# Patient Record
Sex: Female | Born: 1990 | Hispanic: No | Marital: Married | Smoking: Never smoker
Health system: Southern US, Community
[De-identification: ages and names within clinical notes are randomized; demographics above are authoritative.]

## PROBLEM LIST (undated history)

## (undated) ENCOUNTER — Inpatient Hospital Stay (HOSPITAL_COMMUNITY): Payer: Self-pay

## (undated) DIAGNOSIS — H409 Unspecified glaucoma: Secondary | ICD-10-CM

## (undated) DIAGNOSIS — O139 Gestational [pregnancy-induced] hypertension without significant proteinuria, unspecified trimester: Secondary | ICD-10-CM

## (undated) DIAGNOSIS — Z3483 Encounter for supervision of other normal pregnancy, third trimester: Secondary | ICD-10-CM

## (undated) HISTORY — DX: Gestational (pregnancy-induced) hypertension without significant proteinuria, unspecified trimester: O13.9

## (undated) HISTORY — PX: NOSE SURGERY: SHX723

## (undated) HISTORY — PX: EYE SURGERY: SHX253

---
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
## 2012-06-30 ENCOUNTER — Emergency Department (HOSPITAL_COMMUNITY)
Admission: EM | Admit: 2012-06-30 | Discharge: 2012-06-30 | Disposition: A | Discharge status: Home / Self Care | Payer: Medicaid Other | Attending: Emergency Medicine | Admitting: Emergency Medicine

## 2012-06-30 ENCOUNTER — Encounter (HOSPITAL_COMMUNITY): Payer: Self-pay

## 2012-06-30 ENCOUNTER — Emergency Department (HOSPITAL_COMMUNITY): Payer: Medicaid Other

## 2012-06-30 DIAGNOSIS — Z3202 Encounter for pregnancy test, result negative: Secondary | ICD-10-CM | POA: Insufficient documentation

## 2012-06-30 DIAGNOSIS — Z8669 Personal history of other diseases of the nervous system and sense organs: Secondary | ICD-10-CM | POA: Insufficient documentation

## 2012-06-30 DIAGNOSIS — N83209 Unspecified ovarian cyst, unspecified side: Secondary | ICD-10-CM | POA: Insufficient documentation

## 2012-06-30 DIAGNOSIS — N898 Other specified noninflammatory disorders of vagina: Secondary | ICD-10-CM | POA: Insufficient documentation

## 2012-06-30 HISTORY — DX: Unspecified glaucoma: H40.9

## 2012-06-30 LAB — CBC WITH DIFFERENTIAL/PLATELET
Basophils Absolute: 0 10*3/uL (ref 0.0–0.1)
Basophils Relative: 0 % (ref 0–1)
Eosinophils Absolute: 0.1 10*3/uL (ref 0.0–0.7)
HCT: 38.3 % (ref 36.0–46.0)
Hemoglobin: 13.1 g/dL (ref 12.0–15.0)
Lymphocytes Relative: 49 % — ABNORMAL HIGH (ref 12–46)
MCH: 29.6 pg (ref 26.0–34.0)
MCHC: 34.2 g/dL (ref 30.0–36.0)
MCV: 86.7 fL (ref 78.0–100.0)
Monocytes Absolute: 0.5 10*3/uL (ref 0.1–1.0)
Monocytes Relative: 11 % (ref 3–12)
Neutro Abs: 1.5 10*3/uL — ABNORMAL LOW (ref 1.7–7.7)
Neutrophils Relative %: 37 % — ABNORMAL LOW (ref 43–77)
Platelets: 253 10*3/uL (ref 150–400)
RBC: 4.42 MIL/uL (ref 3.87–5.11)
RDW: 12.4 % (ref 11.5–15.5)

## 2012-06-30 LAB — URINALYSIS, MICROSCOPIC ONLY
Bilirubin Urine: NEGATIVE
Glucose, UA: NEGATIVE mg/dL
Hgb urine dipstick: NEGATIVE
Ketones, ur: NEGATIVE mg/dL
Leukocytes, UA: NEGATIVE
Nitrite: NEGATIVE
Protein, ur: NEGATIVE mg/dL
Specific Gravity, Urine: 1.009 (ref 1.005–1.030)
Urobilinogen, UA: 0.2 mg/dL (ref 0.0–1.0)

## 2012-06-30 LAB — COMPREHENSIVE METABOLIC PANEL
AST: 16 U/L (ref 0–37)
Albumin: 4.3 g/dL (ref 3.5–5.2)
Alkaline Phosphatase: 62 U/L (ref 39–117)
BUN: 9 mg/dL (ref 6–23)
CO2: 25 mEq/L (ref 19–32)
Calcium: 9.9 mg/dL (ref 8.4–10.5)
Chloride: 100 mEq/L (ref 96–112)
Creatinine, Ser: 0.55 mg/dL (ref 0.50–1.10)
GFR calc Af Amer: >90 mL/min (ref >90)
GFR calc non Af Amer: >90 mL/min (ref >90)
Glucose, Bld: 91 mg/dL (ref 70–99)
Total Bilirubin: 0.2 mg/dL — ABNORMAL LOW (ref 0.3–1.2)
Total Protein: 7.7 g/dL (ref 6.0–8.3)

## 2012-06-30 LAB — WET PREP, GENITAL
Clue Cells Wet Prep HPF POC: NONE SEEN
Trich, Wet Prep: NONE SEEN
Yeast Wet Prep HPF POC: NONE SEEN

## 2012-06-30 LAB — POCT PREGNANCY, URINE: Preg Test, Ur: NEGATIVE

## 2012-06-30 MED ORDER — HYDROCODONE-ACETAMINOPHEN 2.5-325 MG PO TABS: 1.0000 | ORAL_TABLET | Freq: Three times a day (TID) | ORAL | Status: DC | PRN

## 2012-06-30 NOTE — ED Provider Notes (Signed)
History     CSN: [id number]  Arrival date & time 06/30/12  1402   None    Chief Complaint  Patient presents with  . Abdominal Pain   Patient is a 22 y.o. female presenting with abdominal pain.  Abdominal Pain Associated symptoms: vaginal discharge   Associated symptoms: no constipation, no diarrhea, no dysuria, no nausea, no vaginal bleeding and no vomiting    22 y/o F with PMHx significant for glaucoma presents to ED complaining of abdominal pain for 2 days.The pain is located on RLQ  with no radiation. It is described as intermittent and dull and is not present at this time. Pt states that it hurts on the side with the act of micturition or sometimes if she sneezes or cough. Pain at its worse has been 5/10. Pt had white thin vaginal discharge 2 days before the pain started and then reports has resolved. Pt denies fever, chills, nausea, vomiting, burning with urination or frequency.   Past Medical History  Diagnosis Date  . Glaucoma     Past Surgical History  Procedure Laterality Date  . Eye surgery      History reviewed. No pertinent family history.  History  Substance Use Topics  . Smoking status: Never Smoker   . Smokeless tobacco: Not on file  . Alcohol Use: No   OB History   Grav Para Term Preterm Abortions TAB SAB Ect Mult Living                 Review of Systems  Constitutional: Negative.   HENT: Negative.   Respiratory: Negative.   Cardiovascular: Negative.   Gastrointestinal: Positive for abdominal pain. Negative for nausea, vomiting, diarrhea, constipation and abdominal distention.  Endocrine: Negative.   Genitourinary: Positive for vaginal discharge. Negative for dysuria, flank pain, vaginal bleeding and pelvic pain.  Musculoskeletal: Negative.   Skin: Negative.   Neurological: Negative.   Psychiatric/Behavioral: Negative.    Allergies  Penicillins  Home Medications  No current outpatient prescriptions on file.  BP 132/87  Pulse 100  Temp(Src)  98.1 F (36.7 C) (Oral)  Resp 16  SpO2 100%  LMP 06/12/2012  Physical Exam  Constitutional: She is oriented to person, place, and time. She appears well-developed and well-nourished. No distress.  HENT:  Head: Normocephalic and atraumatic.  Mouth/Throat: No oropharyngeal exudate.  Eyes: Conjunctivae are normal. Pupils are equal, round, and reactive to light.  Neck: Normal range of motion. Neck supple.  Cardiovascular: Normal rate, regular rhythm, normal heart sounds and intact distal pulses.  Exam reveals no gallop and no friction rub.   No murmur heard. Pulmonary/Chest: Effort normal and breath sounds normal.  Abdominal: Soft. Bowel sounds are normal. She exhibits no distension. There is tenderness.  Tenderness to deep palpation on RLQ. No guarding or rebound tenderness.   Genitourinary:  Vulva and perianal area: Normal  Speculum: Vagina and cervix of normal appearance, no friability, no discharge. Bimanual exam: Uterus anteverted, no adnexal masses felt on palpation. No cervical motion tenderness.    Musculoskeletal: Normal range of motion.  Neurological: She is alert and oriented to person, place, and time.    ED Course  Procedures (including critical care time)  Labs Reviewed  CBC WITH DIFFERENTIAL - Abnormal; Notable for the following:    Neutrophils Relative 37 (*)    Neutro Abs 1.5 (*)    Lymphocytes Relative 49 (*)    All other components within normal limits  COMPREHENSIVE METABOLIC PANEL - Abnormal; Notable for the  following:    Total Bilirubin 0.2 (*)    All other components within normal limits  URINALYSIS, MICROSCOPIC ONLY  POCT PREGNANCY, URINE   US Transvaginal Non-ob  06/30/2012  *RADIOLOGY REPORT*  Clinical Data:  22 year old with right lower quadrant pain.  TRANSABDOMINAL AND TRANSVAGINAL ULTRASOUND OF PELVIS DOPPLER ULTRASOUND OF OVARIES  Technique:  Both transabdominal and transvaginal ultrasound examinations of the pelvis were performed. Transabdominal  technique was performed for global imaging of the pelvis including uterus, ovaries, adnexal regions, and pelvic cul-de-sac.  It was necessary to proceed with endovaginal exam following the transabdominal exam to visualize the ovaries.  Color and duplex Doppler ultrasound was utilized to evaluate blood flow to the ovaries.  Comparison:  None.  Findings:  Uterus:  Normal anteverted position of the uterus.  Uterus measures 7.0 x 3.3 x 3.9 cm. A septate uterus cannot be excluded.  Endometrium:  Endometrium measures 1.1 cm.  Right ovary: The right ovary is enlarged and measures 6.0 x 4.4 x 5.0 cm. There is a complex structure within the right ovary that measures 4.5 x 3.3 x 3.7 cm.  The structure has a reticular pattern with multiple echoes.  No significant flow identified within this complex ovarian structure.  Left ovary:   Normal appearance of the left ovary measuring 3.0 x 1.5 x 2.2 cm.  Pulsed Doppler evaluation demonstrates normal low-resistance arterial and venous waveforms in both ovaries.  IMPRESSION: There is a complex lesion involving the right ovary that measures up to 4.5 cm. The lesion has a reticular pattern with multiple internal echoes.  Findings are most compatible with a hemorrhagic cyst.  No evidence for ovarian torsion.   Original Report Authenticated By: Richarda Overlie, M.D.    US Pelvis Complete  06/30/2012  *RADIOLOGY REPORT*  Clinical Data:  22 year old with right lower quadrant pain.  TRANSABDOMINAL AND TRANSVAGINAL ULTRASOUND OF PELVIS DOPPLER ULTRASOUND OF OVARIES  Technique:  Both transabdominal and transvaginal ultrasound examinations of the pelvis were performed. Transabdominal technique was performed for global imaging of the pelvis including uterus, ovaries, adnexal regions, and pelvic cul-de-sac.  It was necessary to proceed with endovaginal exam following the transabdominal exam to visualize the ovaries.  Color and duplex Doppler ultrasound was utilized to evaluate blood flow to the  ovaries.  Comparison:  None.  Findings:  Uterus:  Normal anteverted position of the uterus.  Uterus measures 7.0 x 3.3 x 3.9 cm. A septate uterus cannot be excluded.  Endometrium:  Endometrium measures 1.1 cm.  Right ovary: The right ovary is enlarged and measures 6.0 x 4.4 x 5.0 cm. There is a complex structure within the right ovary that measures 4.5 x 3.3 x 3.7 cm.  The structure has a reticular pattern with multiple echoes.  No significant flow identified within this complex ovarian structure.  Left ovary:   Normal appearance of the left ovary measuring 3.0 x 1.5 x 2.2 cm.  Pulsed Doppler evaluation demonstrates normal low-resistance arterial and venous waveforms in both ovaries.  IMPRESSION: There is a complex lesion involving the right ovary that measures up to 4.5 cm. The lesion has a reticular pattern with multiple internal echoes.  Findings are most compatible with a hemorrhagic cyst.  No evidence for ovarian torsion.   Original Report Authenticated By: Richarda Overlie, M.D.    [place] Art/ven Flow Abd Pelv Doppler  06/30/2012  *RADIOLOGY REPORT*  Clinical Data:  22 year old with right lower quadrant pain.  TRANSABDOMINAL AND TRANSVAGINAL ULTRASOUND OF PELVIS DOPPLER ULTRASOUND OF OVARIES  Technique:  Both transabdominal and transvaginal ultrasound examinations of the pelvis were performed. Transabdominal technique was performed for global imaging of the pelvis including uterus, ovaries, adnexal regions, and pelvic cul-de-sac.  It was necessary to proceed with endovaginal exam following the transabdominal exam to visualize the ovaries.  Color and duplex Doppler ultrasound was utilized to evaluate blood flow to the ovaries.  Comparison:  None.  Findings:  Uterus:  Normal anteverted position of the uterus.  Uterus measures 7.0 x 3.3 x 3.9 cm. A septate uterus cannot be excluded.  Endometrium:  Endometrium measures 1.1 cm.  Right ovary: The right ovary is enlarged and measures 6.0 x 4.4 x 5.0 cm. There is a complex  structure within the right ovary that measures 4.5 x 3.3 x 3.7 cm.  The structure has a reticular pattern with multiple echoes.  No significant flow identified within this complex ovarian structure.  Left ovary:   Normal appearance of the left ovary measuring 3.0 x 1.5 x 2.2 cm.  Pulsed Doppler evaluation demonstrates normal low-resistance arterial and venous waveforms in both ovaries.  IMPRESSION: There is a complex lesion involving the right ovary that measures up to 4.5 cm. The lesion has a reticular pattern with multiple internal echoes.  Findings are most compatible with a hemorrhagic cyst.  No evidence for ovarian torsion.   Original Report Authenticated By: Richarda Overlie, M.D.     No diagnosis found.  MDM   Consulted with OBGYN on call. Pt should follow up as outpatient unless signs of acute abdomen appear. We will discusse signs of worsening condition that should prompt re-evaluation with pt.         Dayarmys Piloto de Criselda Peaches, MD 06/30/12 [phone]

## 2012-06-30 NOTE — ED Notes (Signed)
Pt c/o RLQ pain x2 days, increase pain w/urination or sneezing in the RLQ not the vaginal area, increased amount of clear vaginal dc x5 days, pt denies abnormal odor or vaginal bleeding

## 2012-07-02 NOTE — ED Provider Notes (Signed)
I saw the patient, reviewed the resident's note and I agree with the findings and plan.   .Face to face Exam:  General:  Awake HEENT:  Atraumatic Resp:  Normal effort Abd:  Nondistended Neuro:No focal weakness   Nelia Shi, MD 07/02/12 [phone]

## 2012-07-03 ENCOUNTER — Encounter (HOSPITAL_COMMUNITY): Payer: Self-pay | Admitting: General Practice

## 2012-07-03 ENCOUNTER — Inpatient Hospital Stay (HOSPITAL_COMMUNITY)
Admission: AD | Admit: 2012-07-03 | Discharge: 2012-07-03 | Disposition: A | Discharge status: Home / Self Care | Payer: Medicaid Other | Source: Ambulatory Visit | Attending: Obstetrics & Gynecology | Admitting: Obstetrics & Gynecology

## 2012-07-03 DIAGNOSIS — N83209 Unspecified ovarian cyst, unspecified side: Secondary | ICD-10-CM | POA: Insufficient documentation

## 2012-07-03 DIAGNOSIS — N83201 Unspecified ovarian cyst, right side: Secondary | ICD-10-CM

## 2012-07-03 DIAGNOSIS — R1031 Right lower quadrant pain: Secondary | ICD-10-CM | POA: Insufficient documentation

## 2012-07-03 NOTE — MAU Note (Signed)
Right lower abdominal pain increased last night. Pain recurs every 5-10 min and is an 8/10

## 2012-07-03 NOTE — MAU Note (Signed)
RLQ pain x 6 days, seen @ [facility] on Friday - was told had ovarian cyst, should come here with further problems.  Pain has gotten worse.  Denies bleeding.

## 2012-07-03 NOTE — MAU Provider Note (Signed)
History     CSN: [id number]  Arrival date and time: 07/03/12 1302   First Provider Initiated Contact with Patient 07/03/12 1550      Chief Complaint  Patient presents with  . Abdominal Pain   HPI Ms. Jessica Barton is a 22 y.o. G0 who presents to MAU today with complaint of RLQ pain. The patient was evaluated on Friday at [facility] for the same problem and diagnosed with ovarian cyst. The patient states that the pain is the same as it was then. She has not tried to take any pain medicine yet. She denies vaginal bleeding or abnormal vaginal discharge at this time. The patient already has a follow-up appointment scheduled in clinic for 08/02/12. The patient is concerned that this will impact her ability to conceive. She has been trying to conceive for about 8 months.    OB History   Grav Para Term Preterm Abortions TAB SAB Ect Mult Living                  Past Medical History  Diagnosis Date  . Glaucoma     Past Surgical History  Procedure Laterality Date  . Eye surgery      No family history on file.  History  Substance Use Topics  . Smoking status: Never Smoker   . Smokeless tobacco: Not on file  . Alcohol Use: No    Allergies:  Allergies  Allergen Reactions  . Penicillins Rash    Prescriptions prior to admission  Medication Sig Dispense Refill  . folic acid (FOLVITE) 800 MCG tablet Take 800 mcg by mouth daily.      Marland Kitchen ibuprofen (ADVIL,MOTRIN) 200 MG tablet Take 400 mg by mouth every 6 (six) hours as needed for pain.        Review of Systems  Constitutional: Negative for fever and malaise/fatigue.  Gastrointestinal: Positive for abdominal pain. Negative for nausea, vomiting, diarrhea and constipation.  Genitourinary: Negative for dysuria, urgency and frequency.       Neg - vaginal discharge Neg - vaginal bleeding  Neurological: Negative for dizziness.   Physical Exam   Blood pressure 127/63, pulse 94, temperature 99 F (37.2 C), temperature source Oral, resp.  rate 18, height 5\' 4"  (1.626 m), weight 151 lb 3.2 oz (68.584 kg), last menstrual period 06/15/2012, SpO2 100.00%.  Physical Exam  Constitutional: She is oriented to person, place, and time. She appears well-developed and well-nourished. No distress.  HENT:  Head: Normocephalic and atraumatic.  Cardiovascular: Normal rate, regular rhythm and normal heart sounds.   Respiratory: Effort normal and breath sounds normal. No respiratory distress.  GI: Soft. Bowel sounds are normal. She exhibits no distension and no mass. There is tenderness (moderate right suprapubic tenderness to palpation). There is no rebound and no guarding.  Neurological: She is alert and oriented to person, place, and time.  Skin: Skin is warm and dry. No erythema.  Psychiatric: She has a normal mood and affect.    MAU Course  Procedures None  MDM Full work-up last week at [facility] for RLQ pain. Ovarian cyst. Patient has not taken any pain medications. No change in pain or symptoms since then.   Assessment and Plan  A: Right ovarian cyst  P: Discharge home Patient encouraged to take Ibuprofen for pain or Percocet previously prescribed PRN Patient encouraged to keep scheduled follow-up in clinic  Patient may return to MAU as needed or if her condition were to change or worsen  Freddi Starr,  PA-C  07/03/2012, 3:54 PM

## 2012-08-02 ENCOUNTER — Ambulatory Visit (INDEPENDENT_AMBULATORY_CARE_PROVIDER_SITE_OTHER): Payer: Medicaid Other | Admitting: Obstetrics & Gynecology

## 2012-08-02 ENCOUNTER — Encounter: Payer: Self-pay | Admitting: Obstetrics & Gynecology

## 2012-08-02 VITALS — BP 107/75 | HR 95 | Temp 99.1°F | Ht 64.0 in | Wt 151.9 lb

## 2012-08-02 DIAGNOSIS — N83209 Unspecified ovarian cyst, unspecified side: Secondary | ICD-10-CM

## 2012-08-02 NOTE — Patient Instructions (Addendum)
Ovarian Cyst The ovaries are small organs that are on each side of the uterus. The ovaries are the organs that produce the female hormones, estrogen and progesterone. An ovarian cyst is a sac filled with fluid that can vary in its size. It is normal for a small cyst to form in women who are in the childbearing age and who have menstrual periods. This type of cyst is called a follicle cyst that becomes an ovulation cyst (corpus luteum cyst) after it produces the women's egg. It later goes away on its own if the woman does not become pregnant. There are other kinds of ovarian cysts that may cause problems and may need to be treated. The most serious problem is a cyst with cancer. It should be noted that menopausal women who have an ovarian cyst are at a higher risk of it being a cancer cyst. They should be evaluated very quickly, thoroughly and followed closely. This is especially true in menopausal women because of the high rate of ovarian cancer in women in menopause. CAUSES AND TYPES OF OVARIAN CYSTS:  FUNCTIONAL CYST: The follicle/corpus luteum cyst is a functional cyst that occurs every month during ovulation with the menstrual cycle. They go away with the next menstrual cycle if the woman does not get pregnant. Usually, there are no symptoms with a functional cyst.  ENDOMETRIOMA CYST: This cyst develops from the lining of the uterus tissue. This cyst gets in or on the ovary. It grows every month from the bleeding during the menstrual period. It is also called a "chocolate cyst" because it becomes filled with blood that turns brown. This cyst can cause pain in the lower abdomen during intercourse and with your menstrual period.  CYSTADENOMA CYST: This cyst develops from the cells on the outside of the ovary. They usually are not cancerous. They can get very big and cause lower abdomen pain and pain with intercourse. This type of cyst can twist on itself, cut off its blood supply and cause severe pain. It  also can easily rupture and cause a lot of pain.  DERMOID CYST: This type of cyst is sometimes found in both ovaries. They are found to have different kinds of body tissue in the cyst. The tissue includes skin, teeth, hair, and/or cartilage. They usually do not have symptoms unless they get very big. Dermoid cysts are rarely cancerous.  POLYCYSTIC OVARY: This is a rare condition with hormone problems that produces many small cysts on both ovaries. The cysts are follicle-like cysts that never produce an egg and become a corpus luteum. It can cause an increase in body weight, infertility, acne, increase in body and facial hair and lack of menstrual periods or rare menstrual periods. Many women with this problem develop type 2 diabetes. The exact cause of this problem is unknown. A polycystic ovary is rarely cancerous.  THECA LUTEIN CYST: Occurs when too much hormone (human chorionic gonadotropin) is produced and over-stimulates the ovaries to produce an egg. They are frequently seen when doctors stimulate the ovaries for invitro-fertilization (test tube babies).  LUTEOMA CYST: This cyst is seen during pregnancy. Rarely it can cause an obstruction to the birth canal during labor and delivery. They usually go away after delivery. SYMPTOMS   Pelvic pain or pressure.  Pain during sexual intercourse.  Increasing girth (swelling) of the abdomen.  Abnormal menstrual periods.  Increasing pain with menstrual periods.  You stop having menstrual periods and you are not pregnant. DIAGNOSIS  The diagnosis can   be made during:  Routine or annual pelvic examination (common).  Ultrasound.  X-ray of the pelvis.  CT Scan.  MRI.  Blood tests. TREATMENT   Treatment may only be to follow the cyst monthly for 2 to 3 months with your caregiver. Many go away on their own, especially functional cysts.  May be aspirated (drained) with a long needle with ultrasound, or by laparoscopy (inserting a tube into  the pelvis through a small incision).  The whole cyst can be removed by laparoscopy.  Sometimes the cyst may need to be removed through an incision in the lower abdomen.  Hormone treatment is sometimes used to help dissolve certain cysts.  Birth control pills are sometimes used to help dissolve certain cysts. HOME CARE INSTRUCTIONS  Follow your caregiver's advice regarding:  Medicine.  Follow up visits to evaluate and treat the cyst.  You may need to come back or make an appointment with another caregiver, to find the exact cause of your cyst, if your caregiver is not a gynecologist.  Get your yearly and recommended pelvic examinations and Pap tests.  Let your caregiver know if you have had an ovarian cyst in the past. SEEK MEDICAL CARE IF:   Your periods are late, irregular, they stop, or are painful.  Your stomach (abdomen) or pelvic pain does not go away.  Your stomach becomes larger or swollen.  You have pressure on your bladder or trouble emptying your bladder completely.  You have painful sexual intercourse.  You have feelings of fullness, pressure, or discomfort in your stomach.  You lose weight for no apparent reason.  You feel generally ill.  You become constipated.  You lose your appetite.  You develop acne.  You have an increase in body and facial hair.  You are gaining weight, without changing your exercise and eating habits.  You think you are pregnant. SEEK IMMEDIATE MEDICAL CARE IF:   You have increasing abdominal pain.  You feel sick to your stomach (nausea) and/or vomit.  You develop a fever that comes on suddenly.  You develop abdominal pain during a bowel movement.  Your menstrual periods become heavier than usual. Document Released: 03/22/2005 Document Revised: 06/14/2011 Document Reviewed: 01/23/2009 Exit[employer] Patient Information 2013 [employer].  

## 2012-08-02 NOTE — Progress Notes (Signed)
Subjective:     Patient ID: Jessica Barton, female   DOB: Mar 08, 1991, 22 y.o.   MRN: [id number]  HPI Pt was seen in the ED and was diagnosed with a right ovarian cyst.  She reports that she has had no further since 3 days after she left the ED.  She is actively trying to conceive    Review of Systems     Objective:   Physical Exam BP 107/75  Pulse 95  Temp(Src) 99.1 F (37.3 C) (Oral)  Ht 5\' 4"  (1.626 m)  Wt 151 lb 14.4 oz (68.901 kg)  BMI 26.06 kg/m2  LMP 07/11/2012 Pt in NAD Abd: soft, NT, ND GU: EGBUS: no lesions Vagina: no blood in vault Cervix: no lesion; no CMT Uterus: small, mobile Adnexa: no masses; sl tender on right side.  Left side nontender       07/01/2012 Clinical Data: 22 year old with right lower quadrant pain.  TRANSABDOMINAL AND TRANSVAGINAL ULTRASOUND OF PELVIS  DOPPLER ULTRASOUND OF OVARIES  Technique: Both transabdominal and transvaginal ultrasound  examinations of the pelvis were performed. Transabdominal technique  was performed for global imaging of the pelvis including uterus,  ovaries, adnexal regions, and pelvic cul-de-sac.  It was necessary to proceed with endovaginal exam following the  transabdominal exam to visualize the ovaries.  Color and duplex Doppler ultrasound was utilized to evaluate blood  flow to the ovaries.  Comparison: None.  Findings:  Uterus: Normal anteverted position of the uterus. Uterus measures  7.0 x 3.3 x 3.9 cm. A septate uterus cannot be excluded.  Endometrium: Endometrium measures 1.1 cm.  Right ovary: The right ovary is enlarged and measures 6.0 x 4.4 x  5.0 cm. There is a complex structure within the right ovary that  measures 4.5 x 3.3 x 3.7 cm. The structure has a reticular pattern  with multiple echoes. No significant flow identified within this  complex ovarian structure.  Left ovary: Normal appearance of the left ovary measuring 3.0 x  1.5 x 2.2 cm.  Pulsed Doppler evaluation demonstrates normal  low-resistance  arterial and venous waveforms in both ovaries.  IMPRESSION:  There is a complex lesion involving the right ovary that measures  up to 4.5 cm. The lesion has a reticular pattern with multiple  internal echoes. Findings are most compatible with a hemorrhagic  cyst.  No evidence for ovarian torsion.      Assessment:     Hemorrhagic cyst- sx improved     Plan:     F/u 3 months or sooner F/u sono in 2 months

## 2012-09-27 ENCOUNTER — Ambulatory Visit (HOSPITAL_COMMUNITY)
Admission: RE | Admit: 2012-09-27 | Discharge: 2012-09-27 | Disposition: A | Discharge status: Home / Self Care | Payer: Medicaid Other | Source: Ambulatory Visit | Attending: Obstetrics & Gynecology | Admitting: Obstetrics & Gynecology

## 2012-09-27 DIAGNOSIS — N949 Unspecified condition associated with female genital organs and menstrual cycle: Secondary | ICD-10-CM | POA: Insufficient documentation

## 2012-09-27 DIAGNOSIS — N83209 Unspecified ovarian cyst, unspecified side: Secondary | ICD-10-CM | POA: Insufficient documentation

## 2012-09-27 DIAGNOSIS — R1031 Right lower quadrant pain: Secondary | ICD-10-CM | POA: Insufficient documentation

## 2012-10-05 ENCOUNTER — Telehealth: Payer: Self-pay | Admitting: Pediatrics

## 2012-10-05 ENCOUNTER — Encounter: Payer: Self-pay | Admitting: Obstetrics & Gynecology

## 2012-10-05 ENCOUNTER — Encounter: Payer: Self-pay | Admitting: Pediatrics

## 2012-10-05 NOTE — Telephone Encounter (Signed)
Spoke with patient by telephone to inform her her gyn questions were being misdirected to me (pediatrics) and advised her on contacting the gynecologist.  Patient was courteous and thanked MD for the call.

## 2012-10-15 ENCOUNTER — Encounter: Payer: Self-pay | Admitting: Obstetrics & Gynecology

## 2012-10-25 ENCOUNTER — Ambulatory Visit: Payer: Medicaid Other | Admitting: Family Medicine

## 2013-02-08 ENCOUNTER — Other Ambulatory Visit: Payer: Self-pay

## 2013-03-21 LAB — OB RESULTS CONSOLE GC/CHLAMYDIA
CHLAMYDIA, DNA PROBE: NEGATIVE
Gonorrhea: NEGATIVE

## 2013-03-21 LAB — OB RESULTS CONSOLE HIV ANTIBODY (ROUTINE TESTING): HIV: NONREACTIVE

## 2013-03-23 LAB — OB RESULTS CONSOLE ANTIBODY SCREEN: Antibody Screen: NEGATIVE

## 2013-03-23 LAB — OB RESULTS CONSOLE RPR: RPR: NONREACTIVE

## 2013-03-23 LAB — OB RESULTS CONSOLE RUBELLA ANTIBODY, IGM: Rubella: IMMUNE

## 2013-03-23 LAB — OB RESULTS CONSOLE PLATELET COUNT: PLATELETS: 203 10*3/uL

## 2013-03-23 LAB — OB RESULTS CONSOLE HGB/HCT, BLOOD
HEMATOCRIT: 36 %
HEMOGLOBIN: 12.2 g/dL

## 2013-03-23 LAB — OB RESULTS CONSOLE HEPATITIS B SURFACE ANTIGEN: Hepatitis B Surface Ag: NEGATIVE

## 2013-03-23 LAB — OB RESULTS CONSOLE ABO/RH: RH Type: POSITIVE

## 2013-04-05 NOTE — L&D Delivery Note (Signed)
Attestation of Attending Supervision of Advanced Practitioner: Evaluation and management procedures were performed by the PA/NP/CNM/OB Fellow under my supervision/collaboration. Chart reviewed and agree with management and plan.  Farrah Skoda V 10/22/2013 6:29 AM   

## 2013-04-05 NOTE — L&D Delivery Note (Signed)
Delivery Note Pushed well for just over 30 minutes. Then vertex began to descend quickly.    At 6:06 PM a viable and healthy female was delivered via Vaginal, Spontaneous Delivery (Presentation: Right OP).  APGAR: 9, 9; weight 6 lb 15.1 oz (3150 g).   Placenta status: Intact, Spontaneous.  Cord: 3 vessels with the following complications: None.    Pt was noted to have a Left labial split, partial third degree laceration, and a right sulcus laceration extending 6-8 cm toward cervix.  Dr Emelda FearFerguson consulted for repair and he did the repair in the usual fashion.  Anesthesia: Epidural  Episiotomy: None Lacerations: Partial 3rd degree with right sulcus laceration and left labial split Suture Repair: 3.0 vicryl rapide 4.0 vicryl rapide  Vaginal pack placed by Dr Emelda FearFerguson, to be removed in an hour Est. Blood Loss (mL): 200  Mom to postpartum.  Baby to Couplet care / Skin to Skin.  Wynelle BourgeoisWILLIAMS,Bethan Adamek 10/19/2013, 7:22 PM

## 2013-07-19 DIAGNOSIS — Z363 Encounter for antenatal screening for malformations: Secondary | ICD-10-CM

## 2013-07-19 DIAGNOSIS — O26849 Uterine size-date discrepancy, unspecified trimester: Secondary | ICD-10-CM | POA: Insufficient documentation

## 2013-07-19 LAB — US OB COMP + 14 WK

## 2013-07-23 ENCOUNTER — Encounter: Payer: Self-pay | Admitting: *Deleted

## 2013-07-26 LAB — OB RESULTS CONSOLE GC/CHLAMYDIA
Chlamydia: NEGATIVE
Gonorrhea: NEGATIVE

## 2013-07-26 LAB — OB RESULTS CONSOLE HIV ANTIBODY (ROUTINE TESTING): HIV: NONREACTIVE

## 2013-07-30 LAB — GLUCOSE TOLERANCE, 1 HOUR: Glucose, 1 Hour GTT: 100

## 2013-07-30 LAB — OB RESULTS CONSOLE RPR: RPR: NONREACTIVE

## 2013-08-02 ENCOUNTER — Encounter: Payer: Self-pay | Admitting: Family Medicine

## 2013-08-09 ENCOUNTER — Ambulatory Visit (INDEPENDENT_AMBULATORY_CARE_PROVIDER_SITE_OTHER): Payer: Medicaid Other | Admitting: Obstetrics & Gynecology

## 2013-08-09 ENCOUNTER — Encounter: Payer: Self-pay | Admitting: Obstetrics & Gynecology

## 2013-08-09 VITALS — BP 118/80 | HR 103 | Wt 174.5 lb

## 2013-08-09 DIAGNOSIS — O2341 Unspecified infection of urinary tract in pregnancy, first trimester: Secondary | ICD-10-CM

## 2013-08-09 DIAGNOSIS — N898 Other specified noninflammatory disorders of vagina: Secondary | ICD-10-CM

## 2013-08-09 DIAGNOSIS — Z23 Encounter for immunization: Secondary | ICD-10-CM

## 2013-08-09 DIAGNOSIS — B951 Streptococcus, group B, as the cause of diseases classified elsewhere: Secondary | ICD-10-CM | POA: Insufficient documentation

## 2013-08-09 DIAGNOSIS — O2390 Unspecified genitourinary tract infection in pregnancy, unspecified trimester: Secondary | ICD-10-CM

## 2013-08-09 DIAGNOSIS — Z3490 Encounter for supervision of normal pregnancy, unspecified, unspecified trimester: Secondary | ICD-10-CM | POA: Insufficient documentation

## 2013-08-09 DIAGNOSIS — O26893 Other specified pregnancy related conditions, third trimester: Secondary | ICD-10-CM

## 2013-08-09 DIAGNOSIS — N390 Urinary tract infection, site not specified: Secondary | ICD-10-CM

## 2013-08-09 DIAGNOSIS — Z3480 Encounter for supervision of other normal pregnancy, unspecified trimester: Secondary | ICD-10-CM

## 2013-08-09 DIAGNOSIS — O9989 Other specified diseases and conditions complicating pregnancy, childbirth and the puerperium: Secondary | ICD-10-CM

## 2013-08-09 LAB — POCT URINALYSIS DIP (DEVICE)
BILIRUBIN URINE: NEGATIVE
Glucose, UA: NEGATIVE mg/dL
Hgb urine dipstick: NEGATIVE
Ketones, ur: NEGATIVE mg/dL
LEUKOCYTES UA: NEGATIVE
NITRITE: NEGATIVE
PH: 6.5 (ref 5.0–8.0)
PROTEIN: NEGATIVE mg/dL
Specific Gravity, Urine: 1.025 (ref 1.005–1.030)
Urobilinogen, UA: 0.2 mg/dL (ref 0.0–1.0)

## 2013-08-09 MED ORDER — TETANUS-DIPHTH-ACELL PERTUSSIS 5-2.5-18.5 LF-MCG/0.5 IM SUSP: 0.5000 mL | Freq: Once | INTRAMUSCULAR | Status: DC

## 2013-08-09 NOTE — Progress Notes (Signed)
Pt had a visit after we got [facility], she had 28 week labs done at that visit. Need to request.  Has a large amount of discharge with odor.

## 2013-08-09 NOTE — Progress Notes (Signed)
Transfer of care [facility], awaiting records.  Concern about fetal growth was noted, as [facility] on 07/10/13 at [id number] EFW 745g /<3%, normal anatomy.  Follow up scan on 07/19/13 3969w6d EFW 995g /35%, normal dopplers, BPP 8/8, normal AFI of 11 cm .  Will get follow up scan at 32 weeks Wet prep, GC/Chlam done for abnormal discharge; samples collected by patient as per her request/comfort level; she was instructed on how to do so.  Will follow up results and manage accordingly. No other complaints or concerns.  Fetal movement and labor precautions reviewed. [facility] get third trimester [facility].

## 2013-08-09 NOTE — Patient Instructions (Signed)
Return to clinic for any obstetric concerns or go to MAU for evaluation  

## 2013-08-09 NOTE — Progress Notes (Signed)
U/S scheduled 08/16/13 at 930 am. ROI signed for notes and lab results from [facility]/25/13 visit [facility] [place].

## 2013-08-09 NOTE — Progress Notes (Signed)
Nutrition Note:  First visit Overwt. Prior to Pregnancy.  High wt. Gain which pt. Attributes to junk food intake. Discussed healthy nutrition for pregnancy.   Pt. Eats 3 meals and 2-3 snacks/d.  Drinks water and 1 cup of milk/d. PNV & Folic acid daily. F/U if needed.  Candice C. Earlene Plateravis, MPH, RD, LDN

## 2013-08-10 LAB — GC/CHLAMYDIA PROBE AMP
CT PROBE, AMP APTIMA: NEGATIVE
GC Probe RNA: NEGATIVE

## 2013-08-10 LAB — WET PREP, GENITAL
CLUE CELLS WET PREP: NONE SEEN
TRICH WET PREP: NONE SEEN
Yeast Wet Prep HPF POC: NONE SEEN

## 2013-08-11 LAB — PRESCRIPTION MONITORING PROFILE (19 PANEL)
Amphetamine/Meth: NEGATIVE ng/mL
BARBITURATE SCREEN, URINE: NEGATIVE ng/mL
BENZODIAZEPINE SCREEN, URINE: NEGATIVE ng/mL
BUPRENORPHINE, URINE: NEGATIVE ng/mL
CANNABINOID SCRN UR: NEGATIVE ng/mL
COCAINE METABOLITES: NEGATIVE ng/mL
Carisoprodol, Urine: NEGATIVE ng/mL
Creatinine, Urine: 151.67 mg/dL (ref >20.0)
FENTANYL URINE: NEGATIVE ng/mL
MDMA URINE: NEGATIVE ng/mL
Meperidine, Ur: NEGATIVE ng/mL
Methadone Screen, Urine: NEGATIVE ng/mL
Methaqualone: NEGATIVE ng/mL
Nitrites, Initial: NEGATIVE ug/mL
OPIATE SCREEN, URINE: NEGATIVE ng/mL
Oxycodone Screen, Ur: NEGATIVE ng/mL
PHENCYCLIDINE, UR: NEGATIVE ng/mL
Propoxyphene: NEGATIVE ng/mL
TRAMADOL UR: NEGATIVE ng/mL
Tapentadol, urine: NEGATIVE ng/mL
ZOLPIDEM, URINE: NEGATIVE ng/mL
pH, Initial: 6.7 pH (ref 4.5–8.9)

## 2013-08-11 LAB — CULTURE, OB URINE

## 2013-08-15 ENCOUNTER — Encounter: Payer: Self-pay | Admitting: *Deleted

## 2013-08-16 ENCOUNTER — Ambulatory Visit (HOSPITAL_COMMUNITY)
Admission: RE | Admit: 2013-08-16 | Discharge: 2013-08-16 | Disposition: A | Discharge status: Home / Self Care | Payer: Medicaid Other | Source: Ambulatory Visit | Attending: Obstetrics & Gynecology | Admitting: Obstetrics & Gynecology

## 2013-08-16 DIAGNOSIS — Z3490 Encounter for supervision of normal pregnancy, unspecified, unspecified trimester: Secondary | ICD-10-CM

## 2013-08-16 DIAGNOSIS — Z3689 Encounter for other specified antenatal screening: Secondary | ICD-10-CM | POA: Insufficient documentation

## 2013-08-18 ENCOUNTER — Encounter: Payer: Self-pay | Admitting: *Deleted

## 2013-08-30 ENCOUNTER — Ambulatory Visit (INDEPENDENT_AMBULATORY_CARE_PROVIDER_SITE_OTHER): Payer: Medicaid Other | Admitting: Obstetrics & Gynecology

## 2013-08-30 VITALS — BP 123/72 | HR 103 | Temp 97.6°F | Wt 176.2 lb

## 2013-08-30 DIAGNOSIS — Z3480 Encounter for supervision of other normal pregnancy, unspecified trimester: Secondary | ICD-10-CM

## 2013-08-30 DIAGNOSIS — O2341 Unspecified infection of urinary tract in pregnancy, first trimester: Secondary | ICD-10-CM

## 2013-08-30 DIAGNOSIS — N390 Urinary tract infection, site not specified: Secondary | ICD-10-CM

## 2013-08-30 DIAGNOSIS — O2390 Unspecified genitourinary tract infection in pregnancy, unspecified trimester: Secondary | ICD-10-CM

## 2013-08-30 DIAGNOSIS — B951 Streptococcus, group B, as the cause of diseases classified elsewhere: Secondary | ICD-10-CM

## 2013-08-30 DIAGNOSIS — Z3490 Encounter for supervision of normal pregnancy, unspecified, unspecified trimester: Secondary | ICD-10-CM

## 2013-08-30 LAB — POCT URINALYSIS DIP (DEVICE)
Bilirubin Urine: NEGATIVE
Glucose, UA: NEGATIVE mg/dL
Hgb urine dipstick: NEGATIVE
Ketones, ur: NEGATIVE mg/dL
Nitrite: NEGATIVE
Protein, ur: NEGATIVE mg/dL
Specific Gravity, Urine: 1.02 (ref 1.005–1.030)
UROBILINOGEN UA: 0.2 mg/dL (ref 0.0–1.0)
pH: 7 (ref 5.0–8.0)

## 2013-08-30 NOTE — Patient Instructions (Signed)
Return to clinic for any obstetric concerns or go to MAU for evaluation  

## 2013-08-30 NOTE — Progress Notes (Addendum)
5/14 EFW 1530g /24%; repeat in 4 weeks if there is still concern about fetal growth. No other complaints or concerns.  Fetal movement and labor precautions reviewed.  Pelvic cultures next visit.

## 2013-09-10 ENCOUNTER — Telehealth: Payer: Self-pay

## 2013-09-10 NOTE — Telephone Encounter (Signed)
Patient called stating she will be 36 weeks this Friday and would like to know when she will go into labor-- she has been having some back pain and is unsure if she has been having contractions. Called patient who states she has noticed a clear/mucous like discharge and intermittent lower back pain and pelvic pressure. Informed patient that these are all normal at this point in her pregnancy. Discussed signs of true labor and when to go to MAU. Patient verbalized understanding. No further questions or concerns.

## 2013-09-20 ENCOUNTER — Ambulatory Visit (INDEPENDENT_AMBULATORY_CARE_PROVIDER_SITE_OTHER): Payer: Medicaid Other | Admitting: Family

## 2013-09-20 VITALS — BP 117/82 | HR 114 | Temp 97.5°F | Wt 181.4 lb

## 2013-09-20 DIAGNOSIS — Z3490 Encounter for supervision of normal pregnancy, unspecified, unspecified trimester: Secondary | ICD-10-CM

## 2013-09-20 DIAGNOSIS — Z3480 Encounter for supervision of other normal pregnancy, unspecified trimester: Secondary | ICD-10-CM

## 2013-09-20 LAB — POCT URINALYSIS DIP (DEVICE)
Bilirubin Urine: NEGATIVE
Glucose, UA: NEGATIVE mg/dL
Hgb urine dipstick: NEGATIVE
Ketones, ur: NEGATIVE mg/dL
Leukocytes, UA: NEGATIVE
NITRITE: NEGATIVE
Protein, ur: NEGATIVE mg/dL
Specific Gravity, Urine: 1.025 (ref 1.005–1.030)
Urobilinogen, UA: 0.2 mg/dL (ref 0.0–1.0)
pH: 7 (ref 5.0–8.0)

## 2013-09-20 NOTE — Progress Notes (Signed)
Lower pelvic pressure, no bleeding or leaking of fluid.  BH 2x/day.

## 2013-09-20 NOTE — Progress Notes (Signed)
Edema-feet  Pain/pressure-pelvic, lower abd

## 2013-09-22 LAB — GC/CHLAMYDIA PROBE AMP
CT Probe RNA: NEGATIVE
GC PROBE AMP APTIMA: NEGATIVE

## 2013-09-27 ENCOUNTER — Ambulatory Visit (INDEPENDENT_AMBULATORY_CARE_PROVIDER_SITE_OTHER): Payer: Medicaid Other | Admitting: Obstetrics & Gynecology

## 2013-09-27 VITALS — BP 105/72 | HR 101 | Temp 97.6°F | Wt 180.9 lb

## 2013-09-27 DIAGNOSIS — Z3493 Encounter for supervision of normal pregnancy, unspecified, third trimester: Secondary | ICD-10-CM

## 2013-09-27 DIAGNOSIS — Z3480 Encounter for supervision of other normal pregnancy, unspecified trimester: Secondary | ICD-10-CM

## 2013-09-27 LAB — POCT URINALYSIS DIP (DEVICE)
Bilirubin Urine: NEGATIVE
GLUCOSE, UA: NEGATIVE mg/dL
Hgb urine dipstick: NEGATIVE
Ketones, ur: NEGATIVE mg/dL
NITRITE: NEGATIVE
Protein, ur: NEGATIVE mg/dL
Specific Gravity, Urine: 1.015 (ref 1.005–1.030)
Urobilinogen, UA: 0.2 mg/dL (ref 0.0–1.0)
pH: 6 (ref 5.0–8.0)

## 2013-09-27 NOTE — Progress Notes (Signed)
Edema-feet   

## 2013-09-27 NOTE — Progress Notes (Signed)
Trace edema bilaterally; normal in pregnancy. Patient reassured.  No other complaints or concerns.  Fetal movement and labor precautions reviewed.

## 2013-09-27 NOTE — Patient Instructions (Signed)
Return to clinic for any obstetric concerns or go to MAU for evaluation  

## 2013-09-29 ENCOUNTER — Emergency Department (HOSPITAL_COMMUNITY)
Admission: EM | Admit: 2013-09-29 | Discharge: 2013-09-30 | Disposition: A | Discharge status: Home / Self Care | Payer: Medicaid Other | Attending: Emergency Medicine | Admitting: Emergency Medicine

## 2013-09-29 ENCOUNTER — Emergency Department (HOSPITAL_COMMUNITY): Payer: Medicaid Other

## 2013-09-29 DIAGNOSIS — Z880 Allergy status to penicillin: Secondary | ICD-10-CM | POA: Insufficient documentation

## 2013-09-29 DIAGNOSIS — Y9389 Activity, other specified: Secondary | ICD-10-CM | POA: Insufficient documentation

## 2013-09-29 DIAGNOSIS — Z79899 Other long term (current) drug therapy: Secondary | ICD-10-CM | POA: Insufficient documentation

## 2013-09-29 DIAGNOSIS — S93409A Sprain of unspecified ligament of unspecified ankle, initial encounter: Secondary | ICD-10-CM | POA: Insufficient documentation

## 2013-09-29 DIAGNOSIS — S93402A Sprain of unspecified ligament of left ankle, initial encounter: Secondary | ICD-10-CM

## 2013-09-29 DIAGNOSIS — Y929 Unspecified place or not applicable: Secondary | ICD-10-CM | POA: Insufficient documentation

## 2013-09-29 DIAGNOSIS — Z8669 Personal history of other diseases of the nervous system and sense organs: Secondary | ICD-10-CM | POA: Insufficient documentation

## 2013-09-29 DIAGNOSIS — W108XXA Fall (on) (from) other stairs and steps, initial encounter: Secondary | ICD-10-CM | POA: Insufficient documentation

## 2013-09-29 NOTE — ED Provider Notes (Signed)
CSN: [id number]     Arrival date & time 09/29/13  2235 History  This chart was scribed for non-physician practitioner working with Brandt LoosenJulie Manly MD by Elveria Risingimelie Horne, ED Scribe. This patient was seen in room TR07C/TR07C and the patient's care was started at 10:53 PM.   Chief Complaint  Patient presents with  . Fall     The history is provided by the patient. No language interpreter was used.   HPI Comments: Jessica Barton is a 23 y.o. female who presents to the Emergency Department with a left foot injury. Patient reports missing the last step when descending the stairs and rolling her left ankle outward. Patient reports catching herself after falling. States she fell forward and landed on her hands. Patient denies trauma to her belly during the fall. Patient report's that her pain is exacerbated with bearing weight.   Patient is [redacted] weeks along and denies any complications with pregnancy.    Past Medical History  Diagnosis Date  . Glaucoma    Past Surgical History  Procedure Laterality Date  . Eye surgery    . Nose surgery     Family History  Problem Relation Age of Onset  . Diabetes Father   . Hypertension Father   . Hyperlipidemia Father    History  Substance Use Topics  . Smoking status: Never Smoker   . Smokeless tobacco: Never Used  . Alcohol Use: No   OB History   Grav Para Term Preterm Abortions TAB SAB Ect Mult Living   1 0 0 0 0 0 0 0 0 0      Review of Systems  Constitutional: Negative for fever and chills.  Gastrointestinal: Negative for vomiting and abdominal pain.  Genitourinary: Negative for vaginal bleeding.  Musculoskeletal: Positive for arthralgias. Negative for back pain.  Neurological: Negative for headaches.  Psychiatric/Behavioral: Negative for confusion.   Allergies  Penicillins  Home Medications   Prior to Admission medications   Medication Sig Start Date End Date Taking? Authorizing Provider  folic acid (FOLVITE) 800 MCG tablet Take 800 mcg  by mouth daily.    Historical Provider, MD  ibuprofen (ADVIL,MOTRIN) 200 MG tablet Take 400 mg by mouth every 6 (six) hours as needed for pain.    Historical Provider, MD  Prenatal Vit-Fe Fumarate-FA (PRENATAL MULTIVITAMIN) TABS tablet Take 1 tablet by mouth daily at 12 noon.    Historical Provider, MD   Triage Vitals: BP 121/77  Pulse 101  Temp(Src) 98.1 F (36.7 C) (Oral)  Resp 20  SpO2 100%  LMP 01/05/2013  Physical Exam  Nursing note and vitals reviewed. Constitutional: She appears well-developed and well-nourished. No distress.  HENT:  Head: Normocephalic and atraumatic.  Eyes: Conjunctivae and EOM are normal.  Neck: Normal range of motion. Neck supple.  Cardiovascular: Normal rate.   Pulses:      Dorsalis pedis pulses are 2+ on the right side, and 2+ on the left side.       Posterior tibial pulses are 2+ on the right side, and 2+ on the left side.  Pulmonary/Chest: Effort normal. No respiratory distress.  Abdominal: There is no tenderness. There is no rebound and no guarding.  Gravid, no tenderness to palpation.   Musculoskeletal: She exhibits edema and tenderness.       Left ankle: She exhibits decreased range of motion and swelling. Tenderness. Lateral malleolus tenderness found. No head of 5th metatarsal and no proximal fibula tenderness found. Achilles tendon normal. Achilles tendon exhibits no pain and  no defect.       Left lower leg: Normal.       Left foot: Normal.  Patient complains of pain with palpation of the lateral left ankle. She denies pain with palpation over the fibular head of the affected side. She denies pain in the hip of the affected side.  Neurological: She is alert.  Distal motor, sensation, and vascular intact.   Skin: Skin is warm and dry.  Psychiatric: She has a normal mood and affect. Her behavior is normal.    ED Course  Procedures (including critical care time)  COORDINATION OF CARE: 10:59 PM- Will order X-ray. Discussed treatment plan with  patient at bedside and patient agreed to plan.    Labs Review Labs Reviewed - No data to display  Imaging Review Dg Ankle Complete Left  09/30/2013   CLINICAL DATA:  Lateral ankle pain and swelling after fall.  EXAM: LEFT ANKLE COMPLETE - 3+ VIEW  COMPARISON:  None.  FINDINGS: No fracture deformity nor dislocation. The ankle mortise appears congruent and the tibiofibular syndesmosis intact. No destructive bony lesions. Medial ankle soft tissue swelling without subcutaneous gas or radiopaque foreign bodies.  IMPRESSION: Medial ankle soft tissue swelling without fracture deformity or dislocation.   Electronically Signed   By: Awilda Metroourtnay  Bloomer   On: 09/30/2013 00:21     EKG Interpretation None      Vital signs reviewed and are as follows: Filed Vitals:   09/30/13 0050  BP: 108/68  Pulse: 104  Temp: 98.4 F (36.9 C)  Resp: 18   Patient informed of x-ray results. ASO provided. Crutches declined. Patient counseled on rice protocol and conservative management. At this point in her pregnancy, she is to only use Tylenol for pain.  PCP followup if not improved in one week.  MDM   Final diagnoses:  Ankle sprain, left, initial encounter   Patient with ankle injury. X-rays are negative. Will treat conservatively with rice protocol. PCP followup if no improvement. Tylenol for pain. Her extremity is neurovascularly intact.  I personally performed the services described in this documentation, which was scribed in my presence. The recorded information has been reviewed and is accurate.    Renne CriglerJoshua Geiple, PA-C 09/30/13 0130

## 2013-09-29 NOTE — ED Notes (Signed)
Pt. Larey SeatFell. Missed the last stair when coming down. C/o left foot pain. Pt. Caught herself when falling. Did not fall on stomach.

## 2013-09-30 ENCOUNTER — Encounter (HOSPITAL_COMMUNITY): Payer: Self-pay | Admitting: Emergency Medicine

## 2013-09-30 NOTE — Discharge Instructions (Signed)
Please read and follow all provided instructions.  Your diagnoses today include:  1. Ankle sprain, left, initial encounter     Tests performed today include:  An x-ray of your ankle - does NOT show any broken bones  Vital signs. See below for your results today.   Medications prescribed:   None  Home care instructions:   Follow any educational materials contained in this packet  Use over-the-counter Tylenol only for pain  Follow R.I.C.E. Protocol:  R - rest your injury   I  - use ice on injury without applying directly to skin  C - compress injury with bandage or splint  E - elevate the injury as much as possible  Follow-up instructions: Please follow-up with your primary care provider if you continue to have significant pain or trouble walking in 1 week. In this case you may have a severe sprain that requires further care.   Return instructions:   Please return if your toes are numb or tingling, appear gray or blue, or you have severe pain (also elevate leg and loosen splint or wrap)  Please return to the Emergency Department if you experience worsening symptoms.   Please return if you have any other emergent concerns.  Additional Information:  Your vital signs today were: BP 121/77   Pulse 101   Temp(Src) 98.1 F (36.7 C) (Oral)   Resp 20   SpO2 100%   LMP 01/05/2013 If your blood pressure (BP) was elevated above 135/85 this visit, please have this repeated by your doctor within one month. -------------- Your caregiver has diagnosed you as suffering from an ankle sprain. Ankle sprain occurs when the ligaments that hold the ankle joint together are stretched or torn. It may take 4 to 6 weeks to heal.  For Activity: If prescribed crutches, use crutches with non-weight bearing for the first few days. Then, you may walk on your ankle as the pain allows, or as instructed. Start gradually with weight bearing on the affected ankle. Once you can walk pain free, then try  jogging. When you can run forwards, then you can try moving side-to-side. If you cannot walk without crutches in one week, you need a re-check. --------------

## 2013-10-02 NOTE — ED Provider Notes (Signed)
Medical screening examination/treatment/procedure(s) were performed by non-physician practitioner and as supervising physician I was immediately available for consultation/collaboration.   EKG Interpretation None        Julie Manly, MD 10/02/13 0523 

## 2013-10-08 ENCOUNTER — Ambulatory Visit (INDEPENDENT_AMBULATORY_CARE_PROVIDER_SITE_OTHER): Payer: Medicaid Other | Admitting: Obstetrics & Gynecology

## 2013-10-08 VITALS — BP 125/82 | HR 101 | Temp 97.6°F | Wt 182.2 lb

## 2013-10-08 DIAGNOSIS — Z3493 Encounter for supervision of normal pregnancy, unspecified, third trimester: Secondary | ICD-10-CM

## 2013-10-08 DIAGNOSIS — Z3480 Encounter for supervision of other normal pregnancy, unspecified trimester: Secondary | ICD-10-CM

## 2013-10-08 DIAGNOSIS — O2390 Unspecified genitourinary tract infection in pregnancy, unspecified trimester: Secondary | ICD-10-CM

## 2013-10-08 DIAGNOSIS — N390 Urinary tract infection, site not specified: Secondary | ICD-10-CM

## 2013-10-08 DIAGNOSIS — B951 Streptococcus, group B, as the cause of diseases classified elsewhere: Secondary | ICD-10-CM

## 2013-10-08 DIAGNOSIS — O26849 Uterine size-date discrepancy, unspecified trimester: Secondary | ICD-10-CM

## 2013-10-08 DIAGNOSIS — O2341 Unspecified infection of urinary tract in pregnancy, first trimester: Principal | ICD-10-CM

## 2013-10-08 LAB — POCT URINALYSIS DIP (DEVICE)
Bilirubin Urine: NEGATIVE
GLUCOSE, UA: NEGATIVE mg/dL
HGB URINE DIPSTICK: NEGATIVE
KETONES UR: NEGATIVE mg/dL
Leukocytes, UA: NEGATIVE
Nitrite: NEGATIVE
Protein, ur: NEGATIVE mg/dL
SPECIFIC GRAVITY, URINE: 1.02 (ref 1.005–1.030)
UROBILINOGEN UA: 0.2 mg/dL (ref 0.0–1.0)
pH: 6.5 (ref 5.0–8.0)

## 2013-10-08 NOTE — Progress Notes (Signed)
Pt reports pain rt side past 3 days

## 2013-10-08 NOTE — Progress Notes (Signed)
Right sided back pain, consistent with pregnancy induced pain. Patient reassured. No other complaints or concerns.  Fetal movement and labor precautions reviewed.

## 2013-10-08 NOTE — Patient Instructions (Signed)
Return to clinic for any obstetric concerns or go to MAU for evaluation  

## 2013-10-10 ENCOUNTER — Encounter (HOSPITAL_COMMUNITY): Payer: Self-pay | Admitting: *Deleted

## 2013-10-10 ENCOUNTER — Inpatient Hospital Stay (HOSPITAL_COMMUNITY)
Admission: AD | Admit: 2013-10-10 | Discharge: 2013-10-10 | Disposition: A | Discharge status: Home / Self Care | Payer: Medicaid Other | Source: Ambulatory Visit | Attending: Obstetrics & Gynecology | Admitting: Obstetrics & Gynecology

## 2013-10-10 DIAGNOSIS — N949 Unspecified condition associated with female genital organs and menstrual cycle: Secondary | ICD-10-CM | POA: Insufficient documentation

## 2013-10-10 DIAGNOSIS — O2341 Unspecified infection of urinary tract in pregnancy, first trimester: Secondary | ICD-10-CM

## 2013-10-10 DIAGNOSIS — R102 Pelvic and perineal pain: Secondary | ICD-10-CM

## 2013-10-10 DIAGNOSIS — N390 Urinary tract infection, site not specified: Secondary | ICD-10-CM

## 2013-10-10 DIAGNOSIS — M25559 Pain in unspecified hip: Secondary | ICD-10-CM | POA: Diagnosis present

## 2013-10-10 DIAGNOSIS — O2390 Unspecified genitourinary tract infection in pregnancy, unspecified trimester: Secondary | ICD-10-CM

## 2013-10-10 DIAGNOSIS — O479 False labor, unspecified: Secondary | ICD-10-CM | POA: Insufficient documentation

## 2013-10-10 DIAGNOSIS — Z3493 Encounter for supervision of normal pregnancy, unspecified, third trimester: Secondary | ICD-10-CM

## 2013-10-10 DIAGNOSIS — O99891 Other specified diseases and conditions complicating pregnancy: Secondary | ICD-10-CM | POA: Diagnosis not present

## 2013-10-10 DIAGNOSIS — M549 Dorsalgia, unspecified: Secondary | ICD-10-CM | POA: Diagnosis not present

## 2013-10-10 DIAGNOSIS — Z833 Family history of diabetes mellitus: Secondary | ICD-10-CM | POA: Diagnosis not present

## 2013-10-10 DIAGNOSIS — B951 Streptococcus, group B, as the cause of diseases classified elsewhere: Secondary | ICD-10-CM

## 2013-10-10 DIAGNOSIS — Z22330 Carrier of Group B streptococcus: Secondary | ICD-10-CM | POA: Diagnosis not present

## 2013-10-10 DIAGNOSIS — O9989 Other specified diseases and conditions complicating pregnancy, childbirth and the puerperium: Principal | ICD-10-CM

## 2013-10-10 DIAGNOSIS — O26899 Other specified pregnancy related conditions, unspecified trimester: Secondary | ICD-10-CM

## 2013-10-10 HISTORY — DX: Unspecified glaucoma: H40.9

## 2013-10-10 MED ORDER — ACETAMINOPHEN 500 MG PO TABS
1000.0000 mg | ORAL_TABLET | Freq: Once | ORAL | Status: AC
  Administered 2013-10-10: 1000 mg via ORAL
  Filled 2013-10-10: qty 2

## 2013-10-10 NOTE — MAU Note (Signed)
Patient states she has been having  Right hip an right low back pain for about 4 days. Denies bleeding or leaking and has a mucus discharge. Reports good fetal movement.

## 2013-10-10 NOTE — MAU Provider Note (Signed)
Chief Complaint:  Hip Pain and Back Pain   Iniital contact 1622    HPI: Jessica Barton is a 23 y.o. G1P0000 at 4863w5d who presents to maternity admissions reporting right hip and right LBP worse with walking and moving, not present at rest. No LE radicular sx. No dysuria, urgency, frequency and UA neg 2 days ago. Has not taken anything for pain because afraid to take meds in pregnancy. Told provider about the pain 2 d ago at PN visist and was told it was normal. Cx was 2 cm Denies contractions, leakage of fluid or vaginal bleeding. Good fetal movement.   Pregnancy Course: LRC, late transfer, uncomplicated  Past Medical History: Past Medical History  Diagnosis Date  . Glaucoma   . Glaucoma (increased eye pressure)     Past obstetric history: OB History  Gravida Para Term Preterm AB SAB TAB Ectopic Multiple Living  1 0 0 0 0 0 0 0 0 0     # Outcome Date GA Lbr Len/2nd Weight Sex Delivery Anes PTL Lv  1 CUR               Past Surgical History: Past Surgical History  Procedure Laterality Date  . Eye surgery    . Nose surgery       Family History: Family History  Problem Relation Age of Onset  . Diabetes Father   . Hypertension Father   . Hyperlipidemia Father     Social History: History  Substance Use Topics  . Smoking status: Never Smoker   . Smokeless tobacco: Never Used  . Alcohol Use: No    Allergies:  Allergies  Allergen Reactions  . Penicillins Rash    Meds:  Facility-administered medications prior to admission  Medication Dose Route Frequency Provider Last Rate Last Dose  . Tdap (BOOSTRIX) injection 0.5 mL  0.5 mL Intramuscular Once Tereso NewcomerUgonna A Anyanwu, MD       Prescriptions prior to admission  Medication Sig Dispense Refill  . folic acid (FOLVITE) 800 MCG tablet Take 800 mcg by mouth daily.      . Prenatal Vit-Fe Fumarate-FA (PRENATAL MULTIVITAMIN) TABS tablet Take 1 tablet by mouth daily at 12 noon.        ROS: Pertinent findings in history of present  illness.  Physical Exam  Blood pressure 120/72, pulse 114, temperature 98.6 F (37 C), temperature source Oral, resp. rate 16, last menstrual period 01/05/2013, SpO2 99.00%. GENERAL: Well-developed, well-nourished female in no acute distress.  HEENT: normocephalic HEART: normal rate RESP: normal effort ABDOMEN: Soft, non-tender, gravid appropriate for gestational age BACK: neg CVAT EXTREMITIES: Nontender, no edema NEURO: alert and oriented Dilation: 2 Effacement (%): 70 Station: -2 Presentation: Vertex Exam by:: D.Daison Braxton CNM    FHT:  Baseline 130, moderate variability, accelerations present, no decelerations Contractions: irreg, mild 30-40 sec   Labs: No results found for this or any previous visit (from the past 24 hour(s)).  Imaging:  Dg Ankle Complete Left  09/30/2013   CLINICAL DATA:  Lateral ankle pain and swelling after fall.  EXAM: LEFT ANKLE COMPLETE - 3+ VIEW  COMPARISON:  None.  FINDINGS: No fracture deformity nor dislocation. The ankle mortise appears congruent and the tibiofibular syndesmosis intact. No destructive bony lesions. Medial ankle soft tissue swelling without subcutaneous gas or radiopaque foreign bodies.  IMPRESSION: Medial ankle soft tissue swelling without fracture deformity or dislocation.   Electronically Signed   By: Awilda Metroourtnay  Bloomer   On: 09/30/2013 00:21   MAU Course: Declined  a Flexeril, will try Tylenol> 1000mg  po given  Assessment: 1. Group B Streptococcus urinary tract infection complicating pregnancy in first trimester   2. Supervision of normal pregnancy, third trimester   3. Pain of round ligament affecting pregnancy, antepartum   4. Deberah PeltonBraxton Hicks' contraction   [place] 1 FHR  Plan: Discharge home Labor precautions and fetal kick counts May apply hot or cold compresses to area and take Tylenol as needed   Medication List         folic acid 800 MCG tablet  Commonly known as:  FOLVITE  Take 800 mcg by mouth daily.     prenatal  multivitamin Tabs tablet  Take 1 tablet by mouth daily at 12 noon.       Follow-up Information   Follow up with [facility] On 10/16/2013.   Contact information:   [address] [place] [id number]      Danae Orleanseirdre C Caralynn Gelber, CNM 10/10/2013 4:21 PM

## 2013-10-10 NOTE — Discharge Instructions (Signed)

## 2013-10-13 ENCOUNTER — Encounter (HOSPITAL_COMMUNITY): Payer: Self-pay | Admitting: *Deleted

## 2013-10-13 ENCOUNTER — Inpatient Hospital Stay (HOSPITAL_COMMUNITY)
Admission: AD | Admit: 2013-10-13 | Discharge: 2013-10-13 | Disposition: A | Discharge status: Home / Self Care | Payer: Medicaid Other | Source: Ambulatory Visit | Attending: Obstetrics and Gynecology | Admitting: Obstetrics and Gynecology

## 2013-10-13 DIAGNOSIS — O99891 Other specified diseases and conditions complicating pregnancy: Secondary | ICD-10-CM | POA: Diagnosis present

## 2013-10-13 DIAGNOSIS — O9989 Other specified diseases and conditions complicating pregnancy, childbirth and the puerperium: Principal | ICD-10-CM

## 2013-10-13 DIAGNOSIS — O479 False labor, unspecified: Secondary | ICD-10-CM | POA: Diagnosis not present

## 2013-10-13 NOTE — MAU Provider Note (Signed)
S:  This is a 23 y.o. female at 2170w1d who presents with c/o wetness at 0830 this morning. Had a few more episodes. Did not require a pad. Did not soak through to outer clothes. Has irregular contractions. Next appt Tuesday. Last Cx check was 2cm.  G1P0000  ROS:  Denies N/v, reports abdominal pain and vaginal discharge  O:   Filed Vitals:   10/13/13 2156  BP: 132/79  Pulse: 109  Temp: 98.1 F (36.7 C)  Resp: 20     No apparent distress   HR regular rate   Respirations unlabored    Abdomen soft and nontender    FHR reactive    UCs irregular and mild, some every 2-3 min, others 6 min Dilation: 1.5 Effacement (%): 80 Station: -2 Presentation: Vertex Exam by:: M.Rechel Delosreyes, CNM    Speculum:  No pooling                       No ferning, no bleeding  A:  SIUP at 2670w1d      No evidence of ROM  P:  Discharge home      Labor precautions

## 2013-10-13 NOTE — MAU Note (Signed)
Leaked fld this am about 0830 - not a gush of fld but panties and pants were soaked. Staying wet all day where panties were wet but never a hugh gush of fld. Occ contractions

## 2013-10-13 NOTE — Progress Notes (Signed)
[facility] from T[facility] for further eval of SROM.

## 2013-10-13 NOTE — OB Triage Note (Signed)
M.Williams, CNM at bedside. Sterile speculum exam done. Membranes intact, fern negative. SVE done. 1.5/80/-2. Plan to D/C home.

## 2013-10-13 NOTE — Discharge Summary (Signed)
  Discharge summary is not required for this patient She is a Triage/MAU patient [facility]  She was discharged home and never admitted  Aviva SignsMarie L Inaara Tye, CNM

## 2013-10-13 NOTE — Discharge Instructions (Signed)

## 2013-10-13 NOTE — OB Triage Note (Signed)
Pt states that she has been leaking small amounts of clear fluid since 8:30 this am.

## 2013-10-15 NOTE — MAU Provider Note (Signed)
Attestation of Attending Supervision of Advanced Practitioner: Evaluation and management procedures were performed by the PA/NP/CNM/OB Fellow under my supervision/collaboration. Chart reviewed and agree with management and plan.  Latresha Yahr V 10/15/2013 12:01 AM

## 2013-10-15 NOTE — Discharge Summary (Signed)
Attestation of Attending Supervision of Advanced Practitioner: Evaluation and management procedures were performed by the PA/NP/CNM/OB Fellow under my supervision/collaboration. Chart reviewed and agree with management and plan.  Sayvion Vigen V 10/15/2013 12:01 AM   

## 2013-10-16 ENCOUNTER — Ambulatory Visit (INDEPENDENT_AMBULATORY_CARE_PROVIDER_SITE_OTHER): Payer: Medicaid Other | Admitting: Obstetrics & Gynecology

## 2013-10-16 ENCOUNTER — Encounter: Payer: Self-pay | Admitting: Obstetrics & Gynecology

## 2013-10-16 VITALS — BP 117/76 | HR 90 | Wt 185.3 lb

## 2013-10-16 DIAGNOSIS — O2390 Unspecified genitourinary tract infection in pregnancy, unspecified trimester: Secondary | ICD-10-CM

## 2013-10-16 DIAGNOSIS — Z3480 Encounter for supervision of other normal pregnancy, unspecified trimester: Secondary | ICD-10-CM

## 2013-10-16 DIAGNOSIS — O480 Post-term pregnancy: Secondary | ICD-10-CM

## 2013-10-16 LAB — US OB FOLLOW UP

## 2013-10-16 LAB — POCT URINALYSIS DIP (DEVICE)
Bilirubin Urine: NEGATIVE
GLUCOSE, UA: NEGATIVE mg/dL
HGB URINE DIPSTICK: NEGATIVE
KETONES UR: NEGATIVE mg/dL
Leukocytes, UA: NEGATIVE
NITRITE: NEGATIVE
Protein, ur: NEGATIVE mg/dL
SPECIFIC GRAVITY, URINE: 1.02 (ref 1.005–1.030)
Urobilinogen, UA: 0.2 mg/dL (ref 0.0–1.0)
pH: 7 (ref 5.0–8.0)

## 2013-10-16 NOTE — Progress Notes (Signed)
Yesterday she noticed a small amount of blood in her underwear.

## 2013-10-16 NOTE — Progress Notes (Signed)
Pt is currently observing Ramadan. She desires IOL @ 41 wks - scheduled 7/17 @ 0730

## 2013-10-16 NOTE — Patient Instructions (Signed)
Labor Induction  Labor induction is when steps are taken to cause a pregnant woman to begin the labor process. Most women go into labor on their own between 37 weeks and 42 weeks of the pregnancy. When this does not happen or when there is a medical need, methods may be used to induce labor. Labor induction causes a pregnant woman's uterus to contract. It also causes the cervix to soften (ripen), open (dilate), and thin out (efface). Usually, labor is not induced before 39 weeks of the pregnancy unless there is a problem with the baby or mother.  Before inducing labor, your health care provider will consider a number of factors, including the following:  The medical condition of you and the baby.   How many weeks along you are.   The status of the baby's lung maturity.   The condition of the cervix.   The position of the baby.  WHAT ARE THE REASONS FOR LABOR INDUCTION? Labor may be induced for the following reasons:  The health of the baby or mother is at risk.   The pregnancy is overdue by 1 week or more.   The water breaks but labor does not start on its own.   The mother has a health condition or serious illness, such as high blood pressure, infection, placental abruption, or diabetes.  The amniotic fluid amounts are low around the baby.   The baby is distressed.  Convenience or wanting the baby to be born on a certain date is not a reason for inducing labor. WHAT METHODS ARE USED FOR LABOR INDUCTION? Several methods of labor induction may be used, such as:   Prostaglandin medicine. This medicine causes the cervix to dilate and ripen. The medicine will also start contractions. It can be taken by mouth or by inserting a suppository into the vagina.   Inserting a thin tube (catheter) with a balloon on the end into the vagina to dilate the cervix. Once inserted, the balloon is expanded with water, which causes the cervix to open.   Stripping the membranes. Your health  care provider separates amniotic sac tissue from the cervix, causing the cervix to be stretched and causing the release of a hormone called progesterone. This may cause the uterus to contract. It is often done during an office visit. You will be sent home to wait for the contractions to begin. You will then come in for an induction.   Breaking the water. Your health care provider makes a hole in the amniotic sac using a small instrument. Once the amniotic sac breaks, contractions should begin. This may still take hours to see an effect.   Medicine to trigger or strengthen contractions. This medicine is given through an IV access tube inserted into a vein in your arm.  All of the methods of induction, besides stripping the membranes, will be done in the hospital. Induction is done in the hospital so that you and the baby can be carefully monitored.  HOW LONG DOES IT TAKE FOR LABOR TO BE INDUCED? Some inductions can take up to 2-3 days. Depending on the cervix, it usually takes less time. It takes longer when you are induced early in the pregnancy or if this is your first pregnancy. If a mother is still pregnant and the induction has been going on for 2-3 days, either the mother will be sent home or a cesarean delivery will be needed. WHAT ARE THE RISKS ASSOCIATED WITH LABOR INDUCTION? Some of the risks of induction   include:   Changes in fetal heart rate, such as too high, too low, or erratic.   Fetal distress.   Chance of infection for the mother and baby.   Increased chance of having a cesarean delivery.   Breaking off (abruption) of the placenta from the uterus (rare).   Uterine rupture (very rare).  When induction is needed for medical reasons, the benefits of induction may outweigh the risks. WHAT ARE SOME REASONS FOR NOT INDUCING LABOR? Labor induction should not be done if:   It is shown that your baby does not tolerate labor.   You have had previous surgeries on your  uterus, such as a myomectomy or the removal of fibroids.   Your placenta lies very low in the uterus and blocks the opening of the cervix (placenta previa).   Your baby is not in a head-down position.   The umbilical cord drops down into the birth canal in front of the baby. This could cut off the baby's blood and oxygen supply.   You have had a previous cesarean delivery.   There are unusual circumstances, such as the baby being extremely premature.  Document Released: 08/11/2006 Document Revised: 11/22/2012 Document Reviewed: 10/19/2012 Exit[employer] Patient Information 2015 [employer]. This information is not intended to replace advice given to you by your health care provider. Make sure you discuss any questions you have with your health care provider.  

## 2013-10-16 NOTE — Progress Notes (Signed)
Pt with lost of concerns re the induction/labor process- answered NST reviewed and reactive.  Tykeem Lanzer L. Harraway-Smith, M.D., Evern CoreFACOG

## 2013-10-17 ENCOUNTER — Encounter (HOSPITAL_COMMUNITY): Payer: Self-pay | Admitting: *Deleted

## 2013-10-17 ENCOUNTER — Telehealth (HOSPITAL_COMMUNITY): Payer: Self-pay | Admitting: *Deleted

## 2013-10-17 NOTE — Telephone Encounter (Signed)
Preadmission screen  

## 2013-10-19 ENCOUNTER — Inpatient Hospital Stay (HOSPITAL_COMMUNITY): Payer: Medicaid Other | Admitting: Anesthesiology

## 2013-10-19 ENCOUNTER — Encounter (HOSPITAL_COMMUNITY): Payer: Self-pay

## 2013-10-19 ENCOUNTER — Inpatient Hospital Stay (HOSPITAL_COMMUNITY)
Admission: RE | Admit: 2013-10-19 | Discharge: 2013-10-21 | DRG: 775 | Disposition: A | Discharge status: Home / Self Care | Payer: Medicaid Other | Source: Ambulatory Visit | Attending: Obstetrics & Gynecology | Admitting: Obstetrics & Gynecology

## 2013-10-19 ENCOUNTER — Encounter (HOSPITAL_COMMUNITY): Payer: Medicaid Other | Admitting: Anesthesiology

## 2013-10-19 DIAGNOSIS — Z833 Family history of diabetes mellitus: Secondary | ICD-10-CM

## 2013-10-19 DIAGNOSIS — Z8249 Family history of ischemic heart disease and other diseases of the circulatory system: Secondary | ICD-10-CM

## 2013-10-19 DIAGNOSIS — O480 Post-term pregnancy: Secondary | ICD-10-CM | POA: Diagnosis present

## 2013-10-19 DIAGNOSIS — Z3493 Encounter for supervision of normal pregnancy, unspecified, third trimester: Secondary | ICD-10-CM

## 2013-10-19 DIAGNOSIS — O26849 Uterine size-date discrepancy, unspecified trimester: Secondary | ICD-10-CM

## 2013-10-19 DIAGNOSIS — B951 Streptococcus, group B, as the cause of diseases classified elsewhere: Secondary | ICD-10-CM

## 2013-10-19 DIAGNOSIS — O2341 Unspecified infection of urinary tract in pregnancy, first trimester: Secondary | ICD-10-CM

## 2013-10-19 LAB — CBC
HCT: 37.8 % (ref 36.0–46.0)
Hemoglobin: 12.6 g/dL (ref 12.0–15.0)
MCH: 30 pg (ref 26.0–34.0)
MCHC: 33.3 g/dL (ref 30.0–36.0)
MCV: 90 fL (ref 78.0–100.0)
PLATELETS: 197 10*3/uL (ref 150–400)
RBC: 4.2 MIL/uL (ref 3.87–5.11)
RDW: 13.8 % (ref 11.5–15.5)
WBC: 7.4 10*3/uL (ref 4.0–10.5)

## 2013-10-19 LAB — SAMPLE TO BLOOD BANK

## 2013-10-19 LAB — RPR

## 2013-10-19 MED ORDER — LACTATED RINGERS IV SOLN
INTRAVENOUS | Status: DC
Start: 2013-10-19 — End: 2013-10-19
  Administered 2013-10-19: 125 mL/h via INTRAVENOUS
  Administered 2013-10-19: 08:00:00 via INTRAVENOUS

## 2013-10-19 MED ORDER — OXYTOCIN 40 UNITS IN LACTATED RINGERS INFUSION - SIMPLE MED
1.0000 m[IU]/min | INTRAVENOUS | Status: DC
Start: 2013-10-19 — End: 2013-10-19
  Administered 2013-10-19: 2 m[IU]/min via INTRAVENOUS
  Filled 2013-10-19: qty 1000

## 2013-10-19 MED ORDER — LACTATED RINGERS IV SOLN
500.0000 mL | Freq: Once | INTRAVENOUS | Status: AC
  Administered 2013-10-19: 500 mL via INTRAVENOUS

## 2013-10-19 MED ORDER — TERBUTALINE SULFATE 1 MG/ML IJ SOLN: 0.2500 mg | Freq: Once | INTRAMUSCULAR | Status: DC | PRN

## 2013-10-19 MED ORDER — OXYCODONE-ACETAMINOPHEN 5-325 MG PO TABS: 1.0000 | ORAL_TABLET | ORAL | Status: DC | PRN

## 2013-10-19 MED ORDER — PHENYLEPHRINE 40 MCG/ML (10ML) SYRINGE FOR IV PUSH (FOR BLOOD PRESSURE SUPPORT)
80.0000 ug | PREFILLED_SYRINGE | INTRAVENOUS | Status: DC | PRN
  Filled 2013-10-19: qty 10
  Filled 2013-10-19: qty 2

## 2013-10-19 MED ORDER — LANOLIN HYDROUS EX OINT: TOPICAL_OINTMENT | CUTANEOUS | Status: DC | PRN

## 2013-10-19 MED ORDER — LIDOCAINE HCL (PF) 1 % IJ SOLN
30.0000 mL | INTRAMUSCULAR | Status: DC | PRN
  Filled 2013-10-19: qty 30

## 2013-10-19 MED ORDER — LIDOCAINE HCL (PF) 1 % IJ SOLN
INTRAMUSCULAR | Status: DC | PRN
  Administered 2013-10-19 (×2): 4 mL

## 2013-10-19 MED ORDER — ZOLPIDEM TARTRATE 5 MG PO TABS: 5.0000 mg | ORAL_TABLET | Freq: Every evening | ORAL | Status: DC | PRN

## 2013-10-19 MED ORDER — CITRIC ACID-SODIUM CITRATE 334-500 MG/5ML PO SOLN: 30.0000 mL | ORAL | Status: DC | PRN

## 2013-10-19 MED ORDER — DIPHENHYDRAMINE HCL 50 MG/ML IJ SOLN: 12.5000 mg | INTRAMUSCULAR | Status: DC | PRN

## 2013-10-19 MED ORDER — IBUPROFEN 600 MG PO TABS
600.0000 mg | ORAL_TABLET | Freq: Four times a day (QID) | ORAL | Status: DC | PRN
  Administered 2013-10-19: 600 mg via ORAL
  Filled 2013-10-19: qty 1

## 2013-10-19 MED ORDER — FLEET ENEMA 7-19 GM/118ML RE ENEM: 1.0000 | ENEMA | RECTAL | Status: DC | PRN

## 2013-10-19 MED ORDER — ACETAMINOPHEN 325 MG PO TABS: 650.0000 mg | ORAL_TABLET | ORAL | Status: DC | PRN

## 2013-10-19 MED ORDER — DIBUCAINE 1 % RE OINT: 1.0000 "application " | TOPICAL_OINTMENT | RECTAL | Status: DC | PRN

## 2013-10-19 MED ORDER — FENTANYL 2.5 MCG/ML BUPIVACAINE 1/10 % EPIDURAL INFUSION (WH - ANES)
INTRAMUSCULAR | Status: DC | PRN
  Administered 2013-10-19: 14 mL/h via EPIDURAL

## 2013-10-19 MED ORDER — CLINDAMYCIN PHOSPHATE 900 MG/50ML IV SOLN
900.0000 mg | Freq: Three times a day (TID) | INTRAVENOUS | Status: DC
  Administered 2013-10-19: 900 mg via INTRAVENOUS
  Filled 2013-10-19 (×3): qty 50

## 2013-10-19 MED ORDER — OXYTOCIN BOLUS FROM INFUSION
500.0000 mL | INTRAVENOUS | Status: DC
Start: 2013-10-19 — End: 2013-10-19

## 2013-10-19 MED ORDER — IBUPROFEN 600 MG PO TABS
600.0000 mg | ORAL_TABLET | Freq: Four times a day (QID) | ORAL | Status: DC
  Administered 2013-10-20 – 2013-10-21 (×7): 600 mg via ORAL
  Filled 2013-10-19 (×7): qty 1

## 2013-10-19 MED ORDER — DIPHENHYDRAMINE HCL 25 MG PO CAPS: 25.0000 mg | ORAL_CAPSULE | Freq: Four times a day (QID) | ORAL | Status: DC | PRN

## 2013-10-19 MED ORDER — FENTANYL 2.5 MCG/ML BUPIVACAINE 1/10 % EPIDURAL INFUSION (WH - ANES)
14.0000 mL/h | INTRAMUSCULAR | Status: DC | PRN
  Administered 2013-10-19 (×2): 14 mL/h via EPIDURAL
  Filled 2013-10-19 (×3): qty 125

## 2013-10-19 MED ORDER — MISOPROSTOL 200 MCG PO TABS
600.0000 ug | ORAL_TABLET | Freq: Once | ORAL | Status: AC
  Administered 2013-10-19: 600 ug via ORAL

## 2013-10-19 MED ORDER — ONDANSETRON HCL 4 MG PO TABS: 4.0000 mg | ORAL_TABLET | ORAL | Status: DC | PRN

## 2013-10-19 MED ORDER — LACTATED RINGERS IV SOLN
500.0000 mL | INTRAVENOUS | Status: DC | PRN
  Administered 2013-10-19: 500 mL via INTRAVENOUS

## 2013-10-19 MED ORDER — ONDANSETRON HCL 4 MG/2ML IJ SOLN: 4.0000 mg | INTRAMUSCULAR | Status: DC | PRN

## 2013-10-19 MED ORDER — PRENATAL MULTIVITAMIN CH
1.0000 | ORAL_TABLET | Freq: Every day | ORAL | Status: DC
  Administered 2013-10-20 – 2013-10-21 (×2): 1 via ORAL
  Filled 2013-10-19 (×2): qty 1

## 2013-10-19 MED ORDER — WITCH HAZEL-GLYCERIN EX PADS: 1.0000 "application " | MEDICATED_PAD | CUTANEOUS | Status: DC | PRN

## 2013-10-19 MED ORDER — OXYCODONE-ACETAMINOPHEN 5-325 MG PO TABS
1.0000 | ORAL_TABLET | ORAL | Status: DC | PRN
  Administered 2013-10-20: 1 via ORAL
  Filled 2013-10-19: qty 1

## 2013-10-19 MED ORDER — SIMETHICONE 80 MG PO CHEW: 80.0000 mg | CHEWABLE_TABLET | ORAL | Status: DC | PRN

## 2013-10-19 MED ORDER — BENZOCAINE-MENTHOL 20-0.5 % EX AERO
1.0000 "application " | INHALATION_SPRAY | CUTANEOUS | Status: DC | PRN
  Administered 2013-10-19 – 2013-10-20 (×2): 1 via TOPICAL
  Filled 2013-10-19 (×2): qty 56

## 2013-10-19 MED ORDER — OXYTOCIN 40 UNITS IN LACTATED RINGERS INFUSION - SIMPLE MED
62.5000 mL/h | INTRAVENOUS | Status: DC
  Administered 2013-10-19: 62.5 mL/h via INTRAVENOUS

## 2013-10-19 MED ORDER — SENNOSIDES-DOCUSATE SODIUM 8.6-50 MG PO TABS
2.0000 | ORAL_TABLET | ORAL | Status: DC
  Administered 2013-10-20: 2 via ORAL
  Filled 2013-10-19 (×2): qty 2

## 2013-10-19 MED ORDER — EPHEDRINE 5 MG/ML INJ
10.0000 mg | INTRAVENOUS | Status: DC | PRN
  Filled 2013-10-19: qty 4
  Filled 2013-10-19: qty 2

## 2013-10-19 MED ORDER — TETANUS-DIPHTH-ACELL PERTUSSIS 5-2.5-18.5 LF-MCG/0.5 IM SUSP: 0.5000 mL | Freq: Once | INTRAMUSCULAR | Status: DC

## 2013-10-19 MED ORDER — EPHEDRINE 5 MG/ML INJ
10.0000 mg | INTRAVENOUS | Status: DC | PRN
  Filled 2013-10-19: qty 2

## 2013-10-19 MED ORDER — ONDANSETRON HCL 4 MG/2ML IJ SOLN: 4.0000 mg | Freq: Four times a day (QID) | INTRAMUSCULAR | Status: DC | PRN

## 2013-10-19 MED ORDER — MISOPROSTOL 200 MCG PO TABS
ORAL_TABLET | ORAL | Status: AC
  Filled 2013-10-19: qty 3

## 2013-10-19 MED ORDER — PHENYLEPHRINE 40 MCG/ML (10ML) SYRINGE FOR IV PUSH (FOR BLOOD PRESSURE SUPPORT)
80.0000 ug | PREFILLED_SYRINGE | INTRAVENOUS | Status: DC | PRN
  Filled 2013-10-19: qty 2
  Filled 2013-10-19: qty 10

## 2013-10-19 NOTE — H&P (Signed)
Jessica Barton is a 23 y.o. female presenting for Induction of Labor for postdates.  Maternal Medical History:  Reason for admission: Contractions.  Nausea.  Contractions: Onset was 1-2 hours ago.   Frequency: irregular.   Perceived severity is moderate.    Fetal activity: Perceived fetal activity is normal.   Last perceived fetal movement was within the past hour.    Prenatal complications: No bleeding.   Prenatal Complications - Diabetes: none.    OB History   Grav Para Term Preterm Abortions TAB SAB Ect Mult Living   1 0 0 0 0 0 0 0 0 0      Past Medical History  Diagnosis Date  . Glaucoma   . Glaucoma (increased eye pressure)    Past Surgical History  Procedure Laterality Date  . Eye surgery    . Nose surgery     Family History: family history includes Diabetes in her father; Hyperlipidemia in her father; Hypertension in her father. Social History:  reports that she has never smoked. She has never used smokeless tobacco. She reports that she does not drink alcohol or use illicit drugs.   Review of Systems  Constitutional: Negative for fever and chills.  Gastrointestinal: Positive for abdominal pain (with contractions). Negative for nausea, vomiting, diarrhea and constipation.  Genitourinary: Negative for dysuria.  Neurological: Negative for dizziness and headaches.    Dilation: 2.5 Effacement (%): 100 Station: -2 Exam by:: M.Gailya Tauer,CNM Blood pressure 139/82, pulse 87, temperature 98.3 F (36.8 C), temperature source Oral, resp. rate 20, height 5\' 3"  (1.6 m), weight 83.915 kg (185 lb), last menstrual period 01/05/2013. Maternal Exam:  Uterine Assessment: Contraction strength is moderate.  Contraction frequency is irregular.   Abdomen: Fundal height is 38.   Estimated fetal weight is 7.   Fetal presentation: vertex  Introitus: Normal vulva. Normal vagina.  Vagina is negative for discharge.  Ferning test: not done.  Nitrazine test: not done. Amniotic fluid  character: not assessed.  Pelvis: adequate for delivery.   Cervix: Cervix evaluated by digital exam.     Fetal Exam Fetal Monitor Review: Mode: ultrasound.   Baseline rate: 135.  Variability: moderate (6-25 bpm).   Pattern: accelerations present and no decelerations.    Fetal State Assessment: Category I - tracings are normal.     Physical Exam  Constitutional: She is oriented to person, place, and time. She appears well-developed and well-nourished. No distress.  HENT:  Head: Normocephalic.  Cardiovascular: Normal rate, regular rhythm and normal heart sounds.  Exam reveals no gallop and no friction rub.   No murmur heard. Respiratory: Effort normal and breath sounds normal. No respiratory distress. She has no wheezes. She has no rales.  GI: Soft. There is no tenderness. There is no rebound and no guarding.  Genitourinary: Vagina normal. No vaginal discharge found.  Musculoskeletal: Normal range of motion. She exhibits no edema.  Neurological: She is alert and oriented to person, place, and time.  Skin: Skin is warm and dry.  Psychiatric: She has a normal mood and affect.    Prenatal labs: ABO, Rh: O/Positive/-- (12/19 0000) Antibody: Negative (12/19 0000) Rubella: Immune (12/19 0000) RPR: Nonreactive (04/27 0000)  HBsAg: Negative (12/19 0000)  HIV: Non-reactive (04/23 0000)  GBS:     Assessment/Plan: A;  SIUP at [redacted]w[redacted]d       Postdates      Latent phase labor  P:  Admit to [facility] [employer]      Low Dose Pitocin augmentation of labor  No need for foley      May want epidural soon      Anticipate SVD  Jessica Barton,Jessica Barton 10/19/2013, 9:25 AM

## 2013-10-19 NOTE — Anesthesia Procedure Notes (Signed)
Epidural Patient location during procedure: OB Start time: 10/19/2013 10:11 AM  Staffing Anesthesiologist: Priyanka Causey A. Performed by: anesthesiologist   Preanesthetic Checklist Completed: patient identified, site marked, surgical consent, pre-op evaluation, timeout performed, IV checked, risks and benefits discussed and monitors and equipment checked  Epidural Patient position: sitting Prep: site prepped and draped and DuraPrep Patient monitoring: continuous pulse ox and blood pressure Approach: midline Location: L3-L4 Injection technique: LOR air  Needle:  Needle type: Tuohy  Needle gauge: 17 G Needle length: 9 cm and 9 Needle insertion depth: 4 cm Catheter type: closed end flexible Catheter size: 19 Gauge Catheter at skin depth: 9 cm Test dose: negative and Other  Assessment Events: blood not aspirated, injection not painful, no injection resistance, negative IV test and no paresthesia  Additional Notes Patient identified. Risks and benefits discussed including failed block, incomplete  Pain control, post dural puncture headache, nerve damage, paralysis, blood pressure Changes, nausea, vomiting, reactions to medications-both toxic and allergic and post Partum back pain. All questions were answered. Patient expressed understanding and wished to proceed. Sterile technique was used throughout procedure. Epidural site was Dressed with sterile barrier dressing. No paresthesias, signs of intravascular injection Or signs of intrathecal spread were encountered.  Patient was more comfortable after the epidural was dosed. Please see RN's note for documentation of vital signs and FHR which are stable.

## 2013-10-19 NOTE — Anesthesia Preprocedure Evaluation (Signed)

## 2013-10-20 LAB — ABO/RH: ABO/RH(D): O POS

## 2013-10-20 NOTE — Progress Notes (Signed)
Post Partum Day 1 Subjective: no complaints, up ad lib, voiding, tolerating PO and + flatus  Objective: Blood pressure 146/68, pulse 108, temperature 98.4 F (36.9 C), temperature source Oral, resp. rate 18, height 5\' 3"  (1.6 m), weight 83.915 kg (185 lb), last menstrual period 01/05/2013, SpO2 100.00%, unknown if currently breastfeeding.  Physical Exam:  General: alert and cooperative Lochia: appropriate Uterine Fundus: firm Incision: na DVT Evaluation: No evidence of DVT seen on physical exam. No cords or calf tenderness. No significant calf/ankle edema.   Recent Labs  10/19/13 0827  HGB 12.6  HCT 37.8    Assessment/Plan: Plan for discharge tomorrow, Breastfeeding, Lactation consult and Contraception condoms   LOS: 1 day   Jessica Barton L 10/20/2013, 12:35 PM

## 2013-10-20 NOTE — Anesthesia Postprocedure Evaluation (Signed)
Anesthesia Post Note  Patient: Jessica Barton  Procedure(s) Performed: * No procedures listed *  Anesthesia type: Epidural  Patient location: Mother/Baby  Post pain: Pain level controlled  Post assessment: Post-op Vital signs reviewed  Last Vitals:  Filed Vitals:   10/20/13 0633  BP: 146/68  Pulse: 108  Temp: 36.9 C  Resp: 18    Post vital signs: Reviewed  Level of consciousness:alert  Complications: No apparent anesthesia complications

## 2013-10-20 NOTE — Lactation Note (Signed)
This note was copied from the chart of Jessica Barton S[place]. Lactation Consultation Note  Lactation visit offered and mother declined and asked if we could come back later.  Patient Name: Jessica Barton [place] RUEAV'WToday's Date: 10/20/2013     Maternal Data    Feeding    LATCH Score/Interventions                      Lactation Tools Discussed/Used     Consult Status      Dahlia ByesBerkelhammer, Ruth [facility] 10/20/2013, 3:46 PM

## 2013-10-21 MED ORDER — IBUPROFEN 600 MG PO TABS: 600.0000 mg | ORAL_TABLET | Freq: Four times a day (QID) | ORAL | Status: DC

## 2013-10-21 NOTE — Lactation Note (Signed)
This note was copied from the chart of [name]. Lactation Consultation Note  Mother requested assistance latching her baby.  Encouraged mother to be more independent. Mother did a good job of positioning and latching baby by herself.  Using breast massage to keep her active. Baby has been cluster feeding, very fussy.  Mother wants to be sure formula does not have pork products in it.   Marijean NiemannJaime RN, provided soy formula and mother will supplement after breastfeeding to be sure baby is getting adequate amounts. Mother has volume guidelines. Encouraged her to call if she needs further assistance.    Patient Name: [name] YQMVH'QToday's Date: 10/21/2013 Reason for consult: Initial assessment   Maternal Data    Feeding Feeding Type: Bottle Fed - Formula Length of feed: 20 min  LATCH Score/Interventions Latch: Grasps breast easily, tongue down, lips flanged, rhythmical sucking. Intervention(s): Skin to skin Intervention(s): Assist with latch  Audible Swallowing: None Intervention(s): Hand expression  Type of Nipple: Everted at rest and after stimulation  Comfort (Breast/Nipple): Soft / non-tender  Problem noted: Mild/Moderate discomfort Interventions (Mild/moderate discomfort): Pre-pump if needed;Hand massage  Hold (Positioning): Assistance needed to correctly position infant at breast and maintain latch.  LATCH Score: 7  Lactation Tools Discussed/Used     Consult Status Consult Status: PRN    Jessica Barton, Jessica Barton [facility] 10/21/2013, 11:14 AM

## 2013-10-21 NOTE — Discharge Instructions (Signed)
Vaginal Delivery °Care After °Refer to this sheet in the next few weeks. These discharge instructions provide you with information on caring for yourself after delivery. Your caregiver may also give you specific instructions. Your treatment has been planned according to the most current medical practices available, but problems sometimes occur. Call your caregiver if you have any problems or questions after you go home. °HOME CARE INSTRUCTIONS °· Take over-the-counter or prescription medicines only as directed by your caregiver or pharmacist. °· Do not drink alcohol, especially if you are breastfeeding or taking medicine to relieve pain. °· Do not chew or smoke tobacco. °· Do not use illegal drugs. °· Continue to use good perineal care. Good perineal care includes: °¨ Wiping your perineum from front to back. °¨ Keeping your perineum clean. °· Do not use tampons or douche until your caregiver says it is okay. °· Shower, wash your hair, and take tub baths as directed by your caregiver. °· Wear a well-fitting bra that provides breast support. °· Eat healthy foods. °· Drink enough fluids to keep your urine clear or pale yellow. °· Eat high-fiber foods such as whole grain cereals and breads, brown rice, beans, and fresh fruits and vegetables every day. These foods may help prevent or relieve constipation. °· Follow your cargiver's recommendations regarding resumption of activities such as climbing stairs, driving, lifting, exercising, or traveling. °· Talk to your caregiver about resuming sexual activities. Resumption of sexual activities is dependent upon your risk of infection, your rate of healing, and your comfort and desire to resume sexual activity. °· Try to have someone help you with your household activities and your newborn for at least a few days after you leave the hospital. °· Rest as much as possible. Try to rest or take a nap when your newborn is sleeping. °· Increase your activities gradually. °· Keep all  of your scheduled postpartum appointments. It is very important to keep your scheduled follow-up appointments. At these appointments, your caregiver will be checking to make sure that you are healing physically and emotionally. °SEEK MEDICAL CARE IF:  °· You are passing large clots from your vagina. Save any clots to show your caregiver. °· You have a foul smelling discharge from your vagina. °· You have trouble urinating. °· You are urinating frequently. °· You have pain when you urinate. °· You have a change in your bowel movements. °· You have increasing redness, pain, or swelling near your vaginal incision (episiotomy) or vaginal tear. °· You have pus draining from your episiotomy or vaginal tear. °· Your episiotomy or vaginal tear is separating. °· You have painful, hard, or reddened breasts. °· You have a severe headache. °· You have blurred vision or see spots. °· You feel sad or depressed. °· You have thoughts of hurting yourself or your newborn. °· You have questions about your care, the care of your newborn, or medicines. °· You are dizzy or lightheaded. °· You have a rash. °· You have nausea or vomiting. °· You were breastfeeding and have not had a menstrual period within 12 weeks after you stopped breastfeeding. °· You are not breastfeeding and have not had a menstrual period by the 12th week after delivery. °· You have a fever. °SEEK IMMEDIATE MEDICAL CARE IF:  °· You have persistent pain. °· You have chest pain. °· You have shortness of breath. °· You faint. °· You have leg pain. °· You have stomach pain. °· Your vaginal bleeding saturates two or more sanitary pads   in 1 hour. °MAKE SURE YOU:  °· Understand these instructions. °· Will watch your condition. °· Will get help right away if you are not doing well or get worse. ° ° °Document Released: 03/19/2000 Document Revised: 12/15/2011 Document Reviewed: 11/17/2011 °[employer]® Patient Information ©2015 [employer]. This information is not intended to  replace advice given to you by your health care provider. Make sure you discuss any questions you have with your health care provider. ° °

## 2013-10-21 NOTE — Lactation Note (Signed)
This note was copied from the chart of Jessica Barton [place]. Lactation Consultation Note  Reviewed hand expression, drops of colostrum expressed.   Provided hand pump to evert nipples prior to latching. Assisted mother in cross cradle hold.  Reviewed football but mother prefers cradle, encouraged cross cradle for depth, then switch. Sucks and swallows observed for >25 min.  LS8. Mother's nipples are tender and pink.  Encouraged depth, hand expression breastmilk and air dry and provided comfort gels. Mom encouraged to feed baby 8-12 times/24 hours and with feeding cues.  Reviewed cluster feeding, Baby & Me pp 20-24. Mom made aware of O/P services, breastfeeding support groups, community resources, and our phone # for post-discharge questions.    Patient Name: Jessica Barton [place] RUEAV'WToday's Date: 10/21/2013 Reason for consult: Initial assessment   Maternal Data    Feeding Feeding Type: Breast Fed Length of feed: 35 min  LATCH Score/Interventions Latch: Grasps breast easily, tongue down, lips flanged, rhythmical sucking. Intervention(s): Teach feeding cues;Waking techniques Intervention(s): Adjust position;Assist with latch;Breast massage  Audible Swallowing: Spontaneous and intermittent Intervention(s): Hand expression  Type of Nipple: Everted at rest and after stimulation  Comfort (Breast/Nipple): Filling, red/small blisters or bruises, mild/mod discomfort  Problem noted: Mild/Moderate discomfort Interventions (Mild/moderate discomfort): Hand expression;Comfort gels  Hold (Positioning): Assistance needed to correctly position infant at breast and maintain latch.  LATCH Score: 8  Lactation Tools Discussed/Used     Consult Status Consult Status: PRN    Dahlia ByesBerkelhammer, Ruth [facility] 10/21/2013, 9:06 AM

## 2013-10-21 NOTE — Discharge Summary (Signed)
Obstetric Discharge Summary Reason for Admission: induction of labor due to postdates Prenatal Procedures: NST Intrapartum Procedures: spontaneous vaginal delivery Postpartum Procedures: none Complications-Operative and Postpartum: partial 3rd degree laceration, right sulcal tear, left labial tear Hemoglobin  Date Value Ref Range Status  10/19/2013 12.6  12.0 - 15.0 g/dL Final  09/81/191412/19/2014 78.212.2   Final     HCT  Date Value Ref Range Status  10/19/2013 37.8  36.0 - 46.0 % Final  03/23/2013 36   Final   Hospital Course:  Pt presented for postdates induction and progressed well to deliver via NSVD. Delivery complicated by partial 3rd degree, right sulcal and left labial tear. Postpartum care was uncomplicated. She is breast feeding and desires likely POPs for contraception.    Physical Exam:  General: alert, cooperative and no distress Lochia: appropriate Uterine Fundus: firm Incision: na DVT Evaluation: No evidence of DVT seen on physical exam. No cords or calf tenderness. No significant calf/ankle edema.  Discharge Diagnoses: Post-date pregnancy  Discharge Information: Date: 10/21/2013 Activity: pelvic rest Diet: routine Medications: PNV and Ibuprofen Condition: stable Instructions: refer to practice specific booklet Discharge to: home  Follow-up Information   [facility]   Specialty:  Obstetrics and Gynecology   Contact information:   [address] [place] [address] [phone]      Newborn Data: Live born female  Birth Weight: 6 lb 15.1 oz (3150 g) APGAR: 9, 9  Home with mother.  Dannell Gortney L 10/21/2013, 9:25 AM

## 2013-10-22 ENCOUNTER — Ambulatory Visit: Payer: Self-pay

## 2013-10-22 NOTE — Lactation Note (Signed)
This note was copied from the chart of Jessica Barton. Lactation Consultation Note RN asked me to visit mom, baby fussy, mom frustrated. Mom holding baby wrapped up in lap, baby sucking on pacifier fussing at intervals. Mom states "she is always hungry, feeds for 5 minutes and goes to sleep, then wakes up crying, I'm so tired I haven't hardly slept in 3 days". Baby is rooting acting very hungry. I unwrapped baby, had sleeper on, and onesie. Explained when feeding the baby doesn't need all of those clothes and blankets on. Moms breast are filling, nipples bruised. Bouncy areolas noted, able to roll in finger tips, but not compressible enough for a deep latch d/t the appearance of moms nipples. Hand expression taught again. Mom leaking. Hand expressed 10ml transitional colostrum/milk. #20 NS applied to Rt. Nipple and baby put on in football position. Mom didn't like that position, so positioned in cradle position. Mom frustrated w/NS, stated "I can't see me using those". I explained that most of the time you can wean off of them once your nipples pull out for a deeper latch. Demonstrated the picture on pg. 23 in Baby and me book for proper latch and adequate transfer. Mom tired w/little patients, saying "see she stops sucking'> I explained that they take rest periods that BF is hard work if she doesn't start sucking in 15 seconds then stimulate baby to suck. Talk to the baby." mom said "what do I say"? I explained anything in a calm soothing voice that the baby recognizes moms voice and it calms the baby and makes her feel safe. Mom keeps saying when baby cries " see there she goes again, she will not sleep over 10 min. Then she wakes up crying. Why did I ever get pregnant"? I told mom I felt like she needed a break from the baby and needed to sleep, I explained she is ill acting and not enjoying her baby right now, and if you get a few hrs. Sleep you hopefully will be in a better mood. I reported to RN and nursery  of concern mom has little patience and frustration w/baby.  Patient Name: Jessica Barton UYQIH'KToday's Date: 10/22/2013 Reason for consult: Follow-up assessment;Breast/nipple pain   Maternal Data Has patient been taught Hand Expression?: Yes Does the patient have breastfeeding experience prior to this delivery?: No  Feeding Feeding Type: Breast Fed Length of feed: 15 min  LATCH Score/Interventions Latch: Grasps breast easily, tongue down, lips flanged, rhythmical sucking. Intervention(s): Teach feeding cues;Waking techniques Intervention(s): Adjust position;Assist with latch;Breast massage;Breast compression  Audible Swallowing: Spontaneous and intermittent Intervention(s): Hand expression  Type of Nipple: Everted at rest and after stimulation (short shaft/bouncy areolas)  Comfort (Breast/Nipple): Filling, red/small blisters or bruises, mild/mod discomfort  Problem noted: Mild/Moderate discomfort Interventions (Mild/moderate discomfort): Hand massage;Hand expression;Comfort gels;Breast shields  Hold (Positioning): Assistance needed to correctly position infant at breast and maintain latch. Intervention(s): Breastfeeding basics reviewed;Support Pillows;Position options  LATCH Score: 8  Lactation Tools Discussed/Used Tools: Nipple Shields;Comfort gels;Feeding cup Nipple shield size: 20   Consult Status Consult Status: Follow-up Date: 10/22/13 Follow-up type: In-patient    Jessica Barton, Diamond NickelLAURA G 10/22/2013, 3:56 AM

## 2013-10-22 NOTE — Progress Notes (Signed)
Clinical Social Work Department PSYCHOSOCIAL ASSESSMENT - MATERNAL/CHILD 10/22/2013  Patient:  Jessica Barton, Jessica Barton  Account Number:  [id number]  Admit Date:  10/19/2013  Ardine Eng Name:    Clinical Social Worker:  Terri Piedra, LCSW   Date/Time:  10/22/2013 11:00 AM  Date Referred:  10/22/2013   Referral source  Physician     Referred reason  Behavioral Health Issues   Other referral source:    I:  FAMILY / HOME ENVIRONMENT Child's legal guardian:  PARENT  Guardian - Name Guardian - Age Guardian - Address  Jessica Barton 22 [address] [place], [place] [address]  husband  same   Other household support members/support persons Other support:   [name], [name], states she lives within 10 minutes of MOB and will be highly involved with baby for support and assistance.    II  PSYCHOSOCIAL DATA Information Source:  Family Interview  Occupational hygienist Employment:   FOB is employed at a Administrator, arts resources:  [employer]:  [employer] / Grade:   Maternity Care Coordinator / Child Services Coordination / Early Interventions:  Cultural issues impacting care:   Muslim    III  STRENGTHS Strengths  Adequate Resources  Home prepared for Child (including basic supplies)  Compliance with medical plan  Other - See comment  Supportive family/friends   Strength comment:  Pediatric follow up will be at [facility].   IV  RISK FACTORS AND CURRENT PROBLEMS Current Problem:  YES   Risk Factor & Current Problem Patient Issue Family Issue Risk Factor / Current Problem Comment  Adjustment  Y N MOB appears to have unrealistic expectations of infant and has made concerning remarks regarding adjustment to motherhood         V  SOCIAL WORK ASSESSMENT  CSW met with MOB and [name] after reading documentation and speaking with bedside RN and pediatrician/Dr. Nevada Crane regarding concerning comments made by MOB during hospitalization.   Bedside RN states MOB has seemed exasperated with caring for infant and made comments about having just fed the infant when being told that baby appeared hungry again.  LC notes that MOB made a comment about questioning why she ever got pregnant and being exhausted from not having any sleep for 3 days.  She also seemed upset about the baby's crying.  CSW addressed these concerns in a gentle, but very matter of fact manner with MOB, explaining that statements like this are very concerning, especially so soon after delivery.  MOB said, "I know" after almost everything CSW mentioned.  CSW discussed the enormous responsibility it is to become a parent and validated feelings of exhaustion and being overwhelmed.  CSW also stated, however, that MOB's needs no longer come first now that she has had a baby.  CSW discussed the seriousness of PPD and asked MOB to commit to contacting her doctor if signs and symptoms arise.  She agreed.  [name] was very pleasant and states she is less than 10 minutes from the hospital and will support her daughter whenever needed.  She states she does not work and can be with MOB as much as MOB needs.  CSW sees [name]'s involvement as a huge strength for MOB.  MOB states her husband is involved and supportive and will help with the baby when he is not working.  CSW asked what her mode of transportation is and she states she drives, but that FOB will take off so that  they can go to pediatric appointments together.  CSW recommends Healthy Start services in the home initially to help MOB with the adjustment to motherhood and to monitor for PPD and she appears to be at a heightened risk.  MOB declines CSW offer to make referral, which is her decision.  MOB again assures CSW that she understands the weight of her statements and why the hospital staff is so concerned.  MOB again states that she will talk with her doctor if she has concerns about her emotions moving forward.  She states at this time she  just wants to be in her own environment.  Her mother states she will watch for signs and symptoms of PPD as well and support her daughter in calling her doctor if symptoms arise.  CSW has no further questions and identifies no barriers to discharge.   VI SOCIAL WORK PLAN Social Work Therapist, art  No Further Intervention Required / No Barriers to Discharge   Type of pt/family education:   PPD signs and symptoms/committment to contacting her doctor if symptoms arise   If child protective services report - county:   If child protective services report - date:   Information/referral to community resources comment:   MOB declines CSW suggestion to make referral to community resources   Other social work plan:   CSW will put "sticky note" in baby's chart to ensure that pediatric staff is aware of concerns.    CSW will put "sticky note" in MOB's chart to ensure that clinic staff is aware of concerns when MOB returns for PP check.

## 2013-10-22 NOTE — Progress Notes (Signed)
Post discharge chart review completed.  

## 2013-10-22 NOTE — Discharge Summary (Signed)
Attestation of Attending Supervision of Advanced Practitioner (CNM/NP): Evaluation and management procedures were performed by the Advanced Practitioner under my supervision and collaboration.  I have reviewed the Advanced Practitioner's note and chart, and I agree with the management and plan.  HARRAWAY-SMITH, Hodge Stachnik 11:44 AM     

## 2013-11-28 ENCOUNTER — Encounter: Payer: Self-pay | Admitting: Obstetrics and Gynecology

## 2013-11-28 ENCOUNTER — Ambulatory Visit (INDEPENDENT_AMBULATORY_CARE_PROVIDER_SITE_OTHER): Payer: Medicaid Other | Admitting: Obstetrics and Gynecology

## 2013-11-28 MED ORDER — NORGESTIMATE-ETH ESTRADIOL 0.25-35 MG-MCG PO TABS: 1.0000 | ORAL_TABLET | Freq: Every day | ORAL | Status: DC

## 2013-11-28 NOTE — Progress Notes (Signed)
  Subjective:     Jessica Barton is a 23 y.o. female who presents for a postpartum visit. She is 4 weeks postpartum following a spontaneous vaginal delivery. I have fully reviewed the prenatal and intrapartum course. The delivery was at 41 gestational weeks. Outcome: spontaneous vaginal delivery. Anesthesia: epidural. Postpartum course has been uncomplicated. Baby's course has been uncomplicated. Baby is feeding by bottle - gerber. Bleeding onset of menses. Bowel function is normal. Bladder function is normal. Patient is not sexually active. Contraception method is none. Postpartum depression screening: negative.     Review of Systems A comprehensive review of systems was negative.   Objective:    There were no vitals taken for this visit.  General:  alert, cooperative and no distress   Breasts:  inspection negative, no nipple discharge or bleeding, no masses or nodularity palpable  Lungs: clear to auscultation bilaterally  Heart:  regular rate and rhythm  Abdomen: soft, non-tender; bowel sounds normal; no masses,  no organomegaly   Vulva:  normal  Vagina: normal vagina, no discharge, exudate, lesion, or erythema  Cervix:  multiparous appearance  Corpus: normal size, contour, position, consistency, mobility, non-tender  Adnexa:  no mass, fullness, tenderness  Rectal Exam: Not performed.        Assessment:     Normal postpartum exam. Pap smear not done at today's visit.   Plan:    1. Contraception: undecided but would like Rx OCP- Sprintec provided 2. Patient is medically cleared to resume all activities of daily living 3. Follow up in: as needed.

## 2013-11-28 NOTE — Patient Instructions (Signed)
Contraception Choices Contraception (birth control) is the use of any methods or devices to prevent pregnancy. Below are some methods to help avoid pregnancy. HORMONAL METHODS   Contraceptive implant. This is a thin, plastic tube containing progesterone hormone. It does not contain estrogen hormone. Your health care provider inserts the tube in the inner part of the upper arm. The tube can remain in place for up to 3 years. After 3 years, the implant must be removed. The implant prevents the ovaries from releasing an egg (ovulation), thickens the cervical mucus to prevent sperm from entering the uterus, and thins the lining of the inside of the uterus.  Progesterone-only injections. These injections are given every 3 months by your health care provider to prevent pregnancy. This synthetic progesterone hormone stops the ovaries from releasing eggs. It also thickens cervical mucus and changes the uterine lining. This makes it harder for sperm to survive in the uterus.  Birth control pills. These pills contain estrogen and progesterone hormone. They work by preventing the ovaries from releasing eggs (ovulation). They also cause the cervical mucus to thicken, preventing the sperm from entering the uterus. Birth control pills are prescribed by a health care provider.Birth control pills can also be used to treat heavy periods.  Minipill. This type of birth control pill contains only the progesterone hormone. They are taken every day of each month and must be prescribed by your health care provider.  Birth control patch. The patch contains hormones similar to those in birth control pills. It must be changed once a week and is prescribed by a health care provider.  Vaginal ring. The ring contains hormones similar to those in birth control pills. It is left in the vagina for 3 weeks, removed for 1 week, and then a new one is put back in place. The patient must be comfortable inserting and removing the ring  from the vagina.A health care provider's prescription is necessary.  Emergency contraception. Emergency contraceptives prevent pregnancy after unprotected sexual intercourse. This pill can be taken right after sex or up to 5 days after unprotected sex. It is most effective the sooner you take the pills after having sexual intercourse. Most emergency contraceptive pills are available without a prescription. Check with your pharmacist. Do not use emergency contraception as your only form of birth control. BARRIER METHODS   Female condom. This is a thin sheath (latex or rubber) that is worn over the penis during sexual intercourse. It can be used with spermicide to increase effectiveness.  Female condom. This is a soft, loose-fitting sheath that is put into the vagina before sexual intercourse.  Diaphragm. This is a soft, latex, dome-shaped barrier that must be fitted by a health care provider. It is inserted into the vagina, along with a spermicidal jelly. It is inserted before intercourse. The diaphragm should be left in the vagina for 6 to 8 hours after intercourse.  Cervical cap. This is a round, soft, latex or plastic cup that fits over the cervix and must be fitted by a health care provider. The cap can be left in place for up to 48 hours after intercourse.  Sponge. This is a soft, circular piece of polyurethane foam. The sponge has spermicide in it. It is inserted into the vagina after wetting it and before sexual intercourse.  Spermicides. These are chemicals that kill or block sperm from entering the cervix and uterus. They come in the form of creams, jellies, suppositories, foam, or tablets. They do not require a   prescription. They are inserted into the vagina with an applicator before having sexual intercourse. The process must be repeated every time you have sexual intercourse. INTRAUTERINE CONTRACEPTION  Intrauterine device (IUD). This is a T-shaped device that is put in a woman's uterus  during a menstrual period to prevent pregnancy. There are 2 types:  Copper IUD. This type of IUD is wrapped in copper wire and is placed inside the uterus. Copper makes the uterus and fallopian tubes produce a fluid that kills sperm. It can stay in place for 10 years.  Hormone IUD. This type of IUD contains the hormone progestin (synthetic progesterone). The hormone thickens the cervical mucus and prevents sperm from entering the uterus, and it also thins the uterine lining to prevent implantation of a fertilized egg. The hormone can weaken or kill the sperm that get into the uterus. It can stay in place for 3-5 years, depending on which type of IUD is used. PERMANENT METHODS OF CONTRACEPTION  Female tubal ligation. This is when the woman's fallopian tubes are surgically sealed, tied, or blocked to prevent the egg from traveling to the uterus.  Hysteroscopic sterilization. This involves placing a small coil or insert into each fallopian tube. Your doctor uses a technique called hysteroscopy to do the procedure. The device causes scar tissue to form. This results in permanent blockage of the fallopian tubes, so the sperm cannot fertilize the egg. It takes about 3 months after the procedure for the tubes to become blocked. You must use another form of birth control for these 3 months.  Female sterilization. This is when the female has the tubes that carry sperm tied off (vasectomy).This blocks sperm from entering the vagina during sexual intercourse. After the procedure, the man can still ejaculate fluid (semen). NATURAL PLANNING METHODS  Natural family planning. This is not having sexual intercourse or using a barrier method (condom, diaphragm, cervical cap) on days the woman could become pregnant.  Calendar method. This is keeping track of the length of each menstrual cycle and identifying when you are fertile.  Ovulation method. This is avoiding sexual intercourse during ovulation.  Symptothermal  method. This is avoiding sexual intercourse during ovulation, using a thermometer and ovulation symptoms.  Post-ovulation method. This is timing sexual intercourse after you have ovulated. Regardless of which type or method of contraception you choose, it is important that you use condoms to protect against the transmission of sexually transmitted infections (STIs). Talk with your health care provider about which form of contraception is most appropriate for you. Document Released: 03/22/2005 Document Revised: 03/27/2013 Document Reviewed: 09/14/2012 ExitCare Patient Information 2015 [employer]. This information is not intended to replace advice given to you by your health care provider. Make sure you discuss any questions you have with your health care provider.  

## 2014-02-04 ENCOUNTER — Encounter: Payer: Self-pay | Admitting: Obstetrics and Gynecology

## 2014-03-07 ENCOUNTER — Encounter: Payer: Self-pay | Admitting: Obstetrics & Gynecology

## 2014-03-19 ENCOUNTER — Encounter: Payer: Self-pay | Admitting: *Deleted

## 2015-02-06 ENCOUNTER — Encounter: Payer: Self-pay | Admitting: *Deleted

## 2015-02-06 ENCOUNTER — Ambulatory Visit (INDEPENDENT_AMBULATORY_CARE_PROVIDER_SITE_OTHER): Payer: Medicaid Other | Admitting: Obstetrics and Gynecology

## 2015-02-06 VITALS — BP 121/75 | HR 79 | Wt 169.2 lb

## 2015-02-06 DIAGNOSIS — N912 Amenorrhea, unspecified: Secondary | ICD-10-CM

## 2015-02-06 DIAGNOSIS — Z3201 Encounter for pregnancy test, result positive: Secondary | ICD-10-CM | POA: Diagnosis not present

## 2015-02-06 LAB — POCT URINE PREGNANCY: Preg Test, Ur: POSITIVE — AB

## 2015-02-07 NOTE — Progress Notes (Signed)
Patient here to confirm pregnancy.  Test positive.

## 2015-03-11 LAB — OB RESULTS CONSOLE GC/CHLAMYDIA
Chlamydia: NEGATIVE
Gonorrhea: NEGATIVE

## 2015-03-11 LAB — OB RESULTS CONSOLE RUBELLA ANTIBODY, IGM: Rubella: IMMUNE

## 2015-03-11 LAB — OB RESULTS CONSOLE ABO/RH: RH Type: POSITIVE

## 2015-03-11 LAB — OB RESULTS CONSOLE RPR: RPR: NONREACTIVE

## 2015-03-11 LAB — OB RESULTS CONSOLE HEPATITIS B SURFACE ANTIGEN: Hepatitis B Surface Ag: NEGATIVE

## 2015-03-11 LAB — OB RESULTS CONSOLE HIV ANTIBODY (ROUTINE TESTING): HIV: NONREACTIVE

## 2015-03-11 LAB — OB RESULTS CONSOLE ANTIBODY SCREEN: ANTIBODY SCREEN: NEGATIVE

## 2015-04-06 NOTE — L&D Delivery Note (Signed)
Delivery Note  Pt C/C/+1-2 with BBOW, AROM for copious clear fluid with 3-4 pushes for delivery.    At 12:41 PM a viable and healthy female was delivered via  (Presentation: OA ).  APGAR: 8, 2, 7 - NICU called via code apgar, Dr D assessed, left baby with mother; weight  P.   Placenta status: cord avulsed, manual extraction.  Cord:  3 VC with the following complications: true knot.    Anesthesia:  epidural Episiotomy:  none Lacerations:  2nd degree perineal, periurethral Suture Repair: 3.0 vicryl rapide Est. Blood Loss (mL):  250cc  Mom to postpartum.  Baby to Couplet care / Skin to Skin.  [name], Larrell Rapozo 09/20/2015, 1:05 PM  Br/ O+/ no Tdap/RI/Contra undecided

## 2015-08-17 IMAGING — CR DG ANKLE COMPLETE 3+V*L*
3 series · 3 of 3 positions shown · non-contrast
Comparison: None.

CLINICAL DATA: Lateral ankle pain and swelling after fall.

EXAM:
LEFT ANKLE COMPLETE - 3+ VIEW

[x ankle ap left]
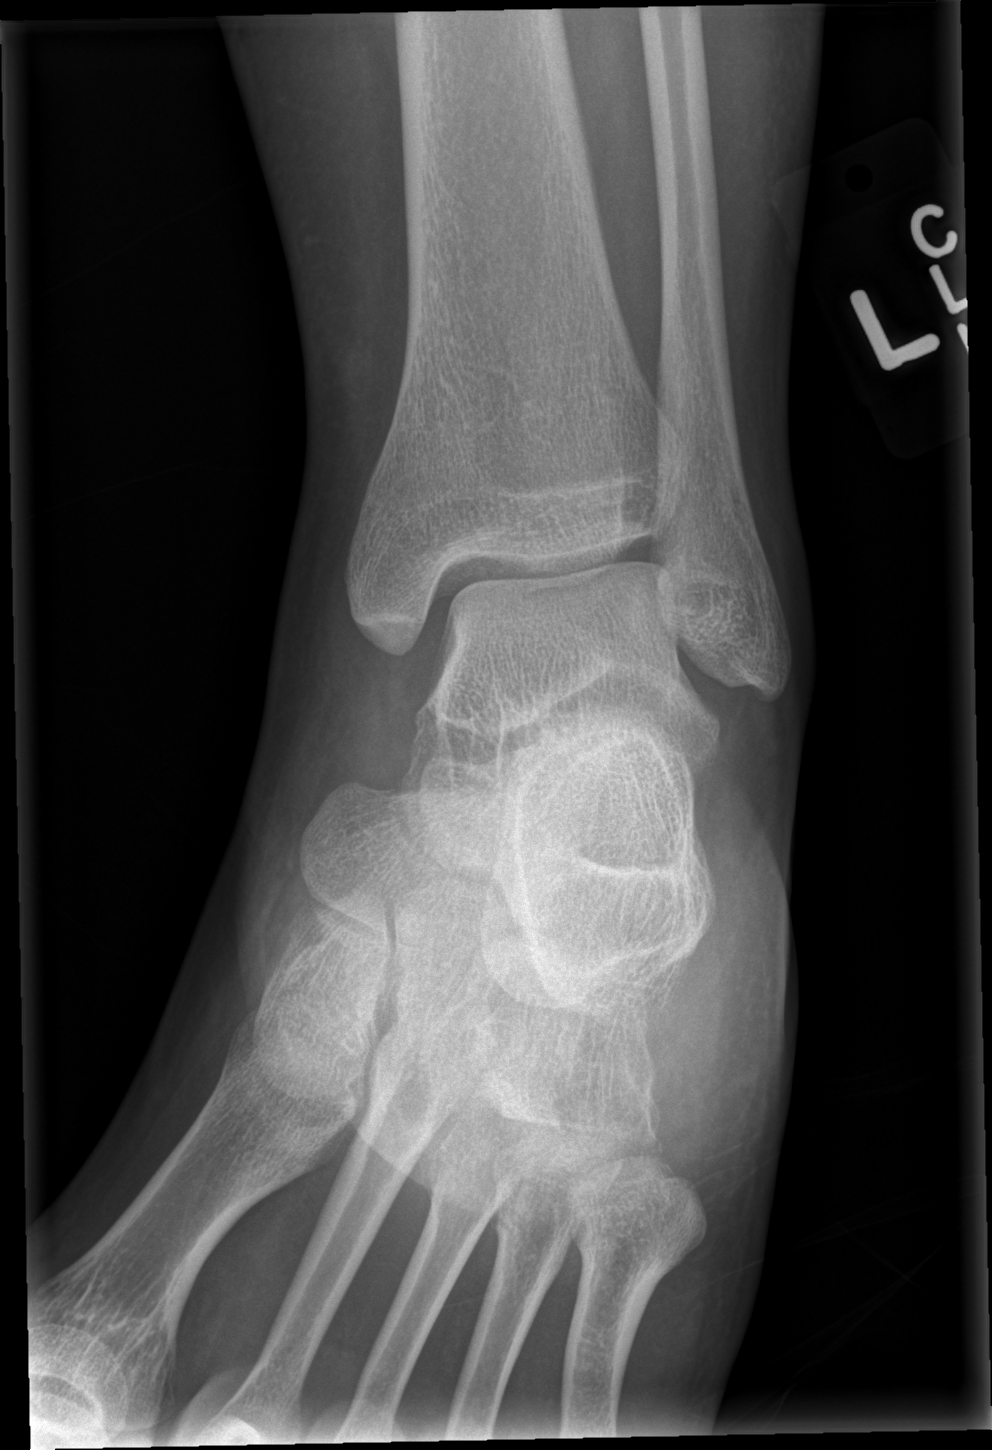

[x ankle obl left]
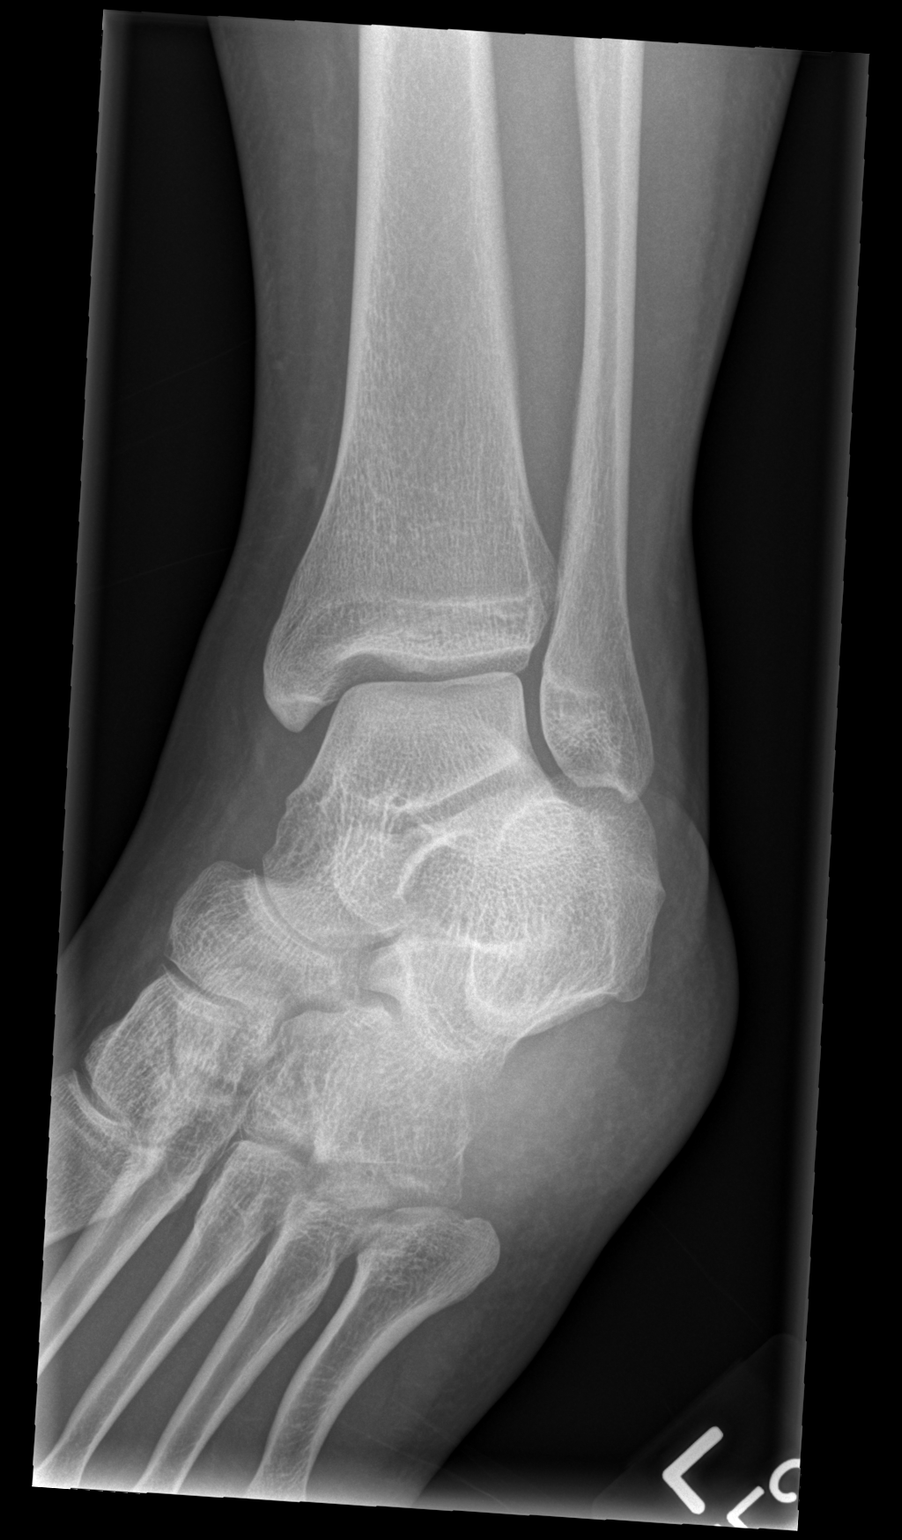

[x ankle lat left]
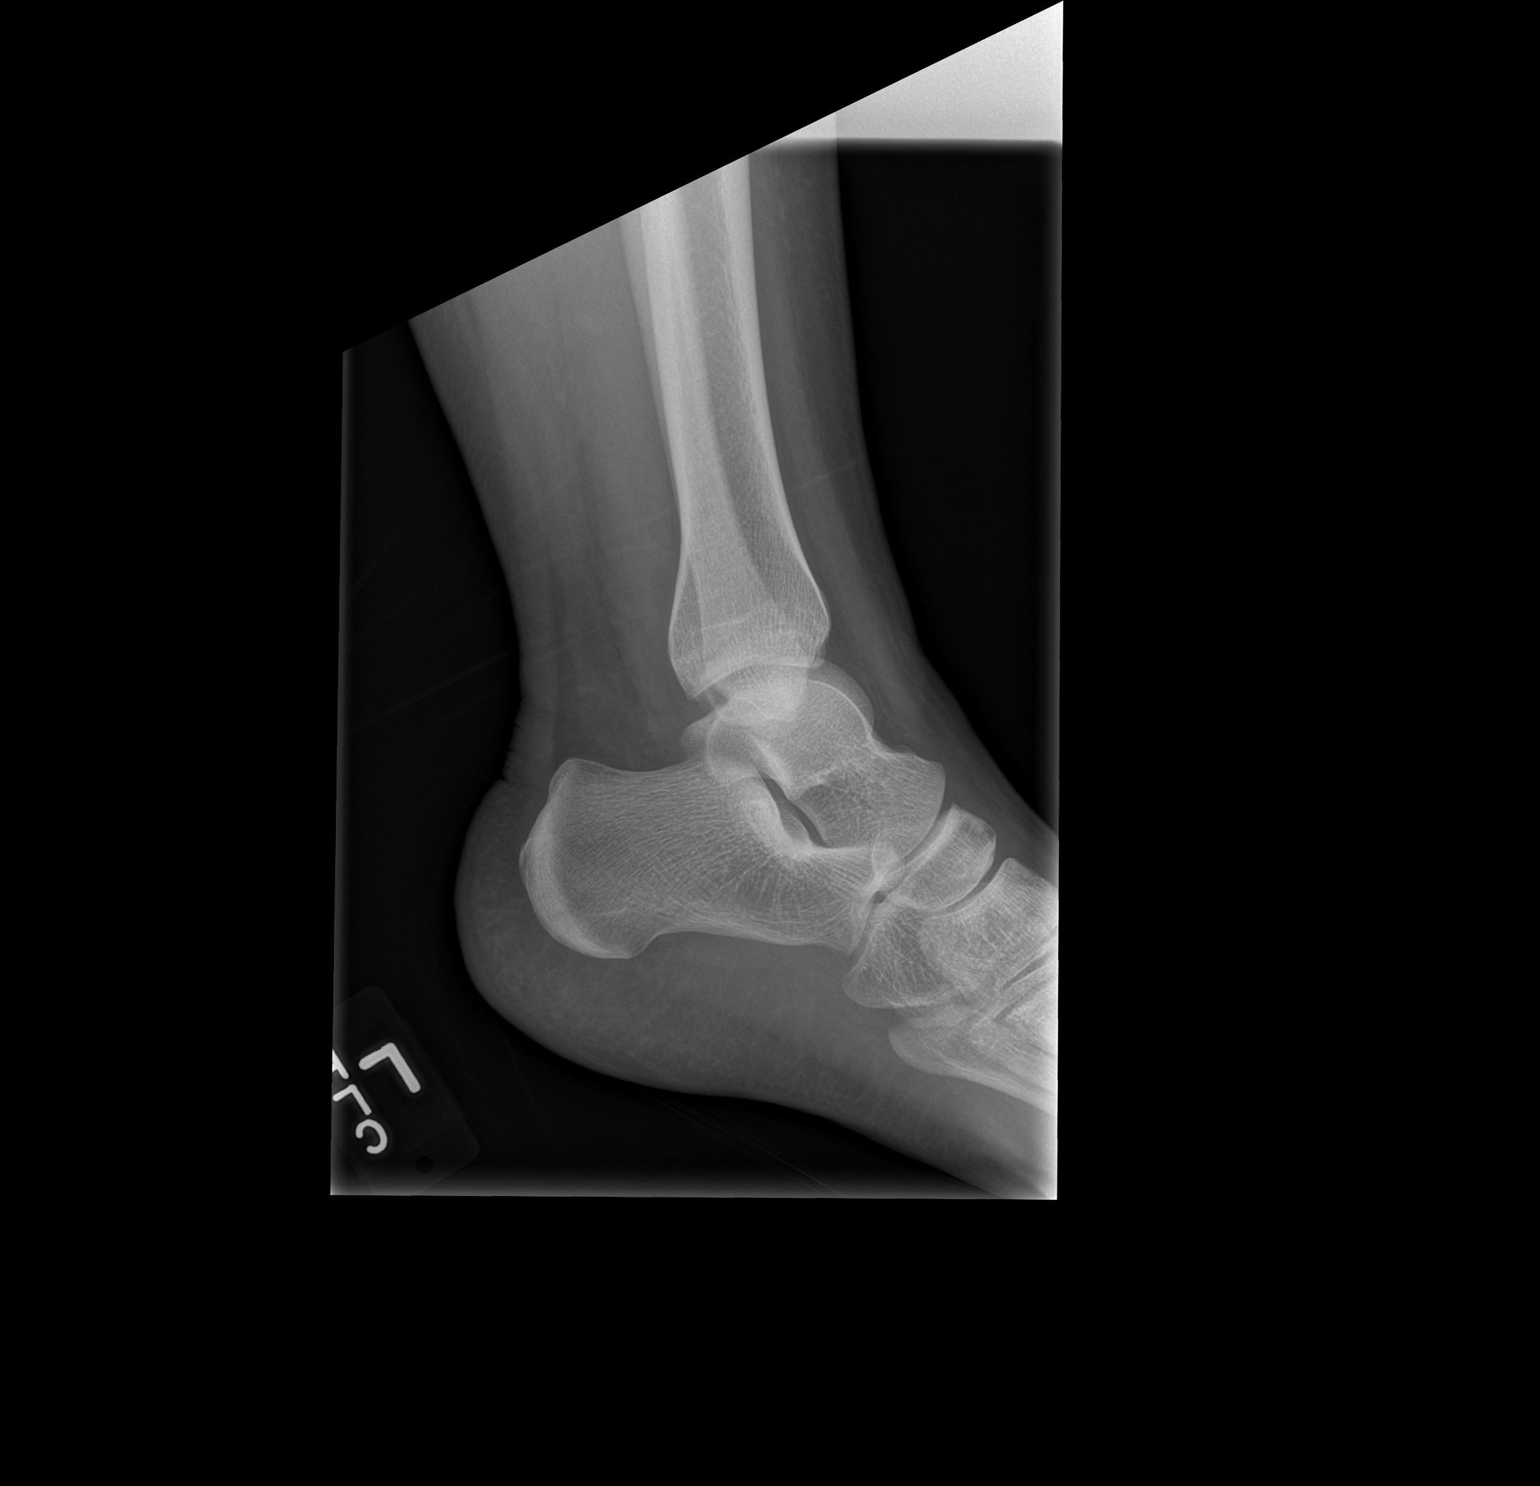

[3 of 3 positions shown; findings below may reference images not displayed]

FINDINGS: No fracture deformity nor dislocation. The ankle mortise appears
congruent and the tibiofibular syndesmosis intact. No destructive
bony lesions. Medial ankle soft tissue swelling without subcutaneous
gas or radiopaque foreign bodies.
IMPRESSION: Medial ankle soft tissue swelling without fracture deformity or
dislocation.

  By: Deondra Ochoa

## 2015-09-10 LAB — OB RESULTS CONSOLE GBS: GBS: NEGATIVE

## 2015-09-19 ENCOUNTER — Inpatient Hospital Stay (HOSPITAL_COMMUNITY)
Admission: AD | Admit: 2015-09-19 | Discharge: 2015-09-22 | DRG: 767 | Disposition: A | Discharge status: Home / Self Care | Payer: Medicaid Other | Source: Ambulatory Visit | Attending: Obstetrics and Gynecology | Admitting: Obstetrics and Gynecology

## 2015-09-19 ENCOUNTER — Encounter (HOSPITAL_COMMUNITY): Payer: Self-pay | Admitting: *Deleted

## 2015-09-19 DIAGNOSIS — Z8249 Family history of ischemic heart disease and other diseases of the circulatory system: Secondary | ICD-10-CM

## 2015-09-19 DIAGNOSIS — Z3A36 36 weeks gestation of pregnancy: Secondary | ICD-10-CM

## 2015-09-19 DIAGNOSIS — Z833 Family history of diabetes mellitus: Secondary | ICD-10-CM | POA: Diagnosis not present

## 2015-09-19 DIAGNOSIS — Z3483 Encounter for supervision of other normal pregnancy, third trimester: Secondary | ICD-10-CM

## 2015-09-19 HISTORY — DX: Encounter for supervision of other normal pregnancy, third trimester: Z34.83

## 2015-09-19 LAB — CBC
HCT: 33.4 % — ABNORMAL LOW (ref 36.0–46.0)
HEMOGLOBIN: 11.2 g/dL — AB (ref 12.0–15.0)
MCH: 28.7 pg (ref 26.0–34.0)
MCHC: 33.5 g/dL (ref 30.0–36.0)
MCV: 85.6 fL (ref 78.0–100.0)
Platelets: 204 10*3/uL (ref 150–400)
RBC: 3.9 MIL/uL (ref 3.87–5.11)
RDW: 14.2 % (ref 11.5–15.5)
WBC: 9.7 10*3/uL (ref 4.0–10.5)

## 2015-09-19 LAB — TYPE AND SCREEN
ABO/RH(D): O POS
Antibody Screen: NEGATIVE

## 2015-09-19 MED ORDER — ONDANSETRON HCL 4 MG/2ML IJ SOLN: 4.0000 mg | Freq: Four times a day (QID) | INTRAMUSCULAR | Status: DC | PRN

## 2015-09-19 MED ORDER — SOD CITRATE-CITRIC ACID 500-334 MG/5ML PO SOLN: 30.0000 mL | ORAL | Status: DC | PRN

## 2015-09-19 MED ORDER — OXYCODONE-ACETAMINOPHEN 5-325 MG PO TABS
1.0000 | ORAL_TABLET | ORAL | Status: DC | PRN
Start: 2015-09-19 — End: 2015-09-20

## 2015-09-19 MED ORDER — LACTATED RINGERS IV SOLN
INTRAVENOUS | Status: DC
  Administered 2015-09-19 – 2015-09-20 (×3): via INTRAVENOUS

## 2015-09-19 MED ORDER — ACETAMINOPHEN 325 MG PO TABS: 650.0000 mg | ORAL_TABLET | ORAL | Status: DC | PRN

## 2015-09-19 MED ORDER — OXYTOCIN 40 UNITS IN LACTATED RINGERS INFUSION - SIMPLE MED
2.5000 [IU]/h | INTRAVENOUS | Status: DC
  Administered 2015-09-20: 2.5 [IU]/h via INTRAVENOUS
  Filled 2015-09-19: qty 1000

## 2015-09-19 MED ORDER — FLEET ENEMA 7-19 GM/118ML RE ENEM: 1.0000 | ENEMA | RECTAL | Status: DC | PRN

## 2015-09-19 MED ORDER — LACTATED RINGERS IV BOLUS (SEPSIS)
1000.0000 mL | Freq: Once | INTRAVENOUS | Status: AC
  Administered 2015-09-19: 1000 mL via INTRAVENOUS

## 2015-09-19 MED ORDER — LACTATED RINGERS IV SOLN
500.0000 mL | INTRAVENOUS | Status: DC | PRN
  Administered 2015-09-20 (×2): 500 mL via INTRAVENOUS

## 2015-09-19 MED ORDER — BETAMETHASONE SOD PHOS & ACET 6 (3-3) MG/ML IJ SUSP
12.0000 mg | Freq: Once | INTRAMUSCULAR | Status: AC
  Administered 2015-09-19: 12 mg via INTRAMUSCULAR
  Filled 2015-09-19: qty 2

## 2015-09-19 MED ORDER — LIDOCAINE HCL (PF) 1 % IJ SOLN
30.0000 mL | INTRAMUSCULAR | Status: DC | PRN
  Filled 2015-09-19: qty 30

## 2015-09-19 MED ORDER — CLINDAMYCIN PHOSPHATE 900 MG/50ML IV SOLN
900.0000 mg | Freq: Three times a day (TID) | INTRAVENOUS | Status: DC
  Filled 2015-09-19: qty 50

## 2015-09-19 MED ORDER — OXYTOCIN BOLUS FROM INFUSION: 500.0000 mL | INTRAVENOUS | Status: DC

## 2015-09-19 MED ORDER — OXYCODONE-ACETAMINOPHEN 5-325 MG PO TABS: 2.0000 | ORAL_TABLET | ORAL | Status: DC | PRN

## 2015-09-19 NOTE — Progress Notes (Signed)
tct Dr. Ellyn HackBovard, orders recv'd/. Will continue to monitor and recheck vaginal exam in 2 hours and update Dr. Ellyn HackBovard then.

## 2015-09-19 NOTE — MAU Note (Signed)
Contractions since 3am and past 2 hours they have been coming every 5 min. No leaking or bleeding

## 2015-09-20 ENCOUNTER — Inpatient Hospital Stay (HOSPITAL_COMMUNITY): Payer: Medicaid Other | Admitting: Anesthesiology

## 2015-09-20 ENCOUNTER — Encounter (HOSPITAL_COMMUNITY): Payer: Self-pay | Admitting: Obstetrics and Gynecology

## 2015-09-20 DIAGNOSIS — Z3483 Encounter for supervision of other normal pregnancy, third trimester: Secondary | ICD-10-CM

## 2015-09-20 HISTORY — DX: Encounter for supervision of other normal pregnancy, third trimester: Z34.83

## 2015-09-20 LAB — RPR: RPR Ser Ql: NONREACTIVE

## 2015-09-20 MED ORDER — ONDANSETRON HCL 4 MG PO TABS: 4.0000 mg | ORAL_TABLET | ORAL | Status: DC | PRN

## 2015-09-20 MED ORDER — ACETAMINOPHEN 325 MG PO TABS: 650.0000 mg | ORAL_TABLET | ORAL | Status: DC | PRN

## 2015-09-20 MED ORDER — IBUPROFEN 600 MG PO TABS
600.0000 mg | ORAL_TABLET | Freq: Four times a day (QID) | ORAL | Status: DC
  Administered 2015-09-20 – 2015-09-22 (×8): 600 mg via ORAL
  Filled 2015-09-20 (×8): qty 1

## 2015-09-20 MED ORDER — EPHEDRINE 5 MG/ML INJ
10.0000 mg | INTRAVENOUS | Status: DC | PRN
  Filled 2015-09-20: qty 2

## 2015-09-20 MED ORDER — LACTATED RINGERS IV SOLN: INTRAVENOUS | Status: DC

## 2015-09-20 MED ORDER — LIDOCAINE HCL (PF) 1 % IJ SOLN
INTRAMUSCULAR | Status: DC | PRN
  Administered 2015-09-20 (×2): 4 mL via EPIDURAL

## 2015-09-20 MED ORDER — PHENYLEPHRINE 40 MCG/ML (10ML) SYRINGE FOR IV PUSH (FOR BLOOD PRESSURE SUPPORT)
80.0000 ug | PREFILLED_SYRINGE | INTRAVENOUS | Status: DC | PRN
  Filled 2015-09-20: qty 5

## 2015-09-20 MED ORDER — ONDANSETRON HCL 4 MG/2ML IJ SOLN: 4.0000 mg | INTRAMUSCULAR | Status: DC | PRN

## 2015-09-20 MED ORDER — LACTATED RINGERS IV SOLN: 500.0000 mL | Freq: Once | INTRAVENOUS | Status: DC

## 2015-09-20 MED ORDER — SENNOSIDES-DOCUSATE SODIUM 8.6-50 MG PO TABS
2.0000 | ORAL_TABLET | ORAL | Status: DC
  Administered 2015-09-21 (×2): 2 via ORAL
  Filled 2015-09-20 (×2): qty 2

## 2015-09-20 MED ORDER — PHENYLEPHRINE 40 MCG/ML (10ML) SYRINGE FOR IV PUSH (FOR BLOOD PRESSURE SUPPORT)
80.0000 ug | PREFILLED_SYRINGE | INTRAVENOUS | Status: DC | PRN
  Filled 2015-09-20: qty 10
  Filled 2015-09-20: qty 5

## 2015-09-20 MED ORDER — WITCH HAZEL-GLYCERIN EX PADS: 1.0000 "application " | MEDICATED_PAD | CUTANEOUS | Status: DC | PRN

## 2015-09-20 MED ORDER — FENTANYL 2.5 MCG/ML BUPIVACAINE 1/10 % EPIDURAL INFUSION (WH - ANES)
14.0000 mL/h | INTRAMUSCULAR | Status: DC | PRN
  Administered 2015-09-20 (×2): 14 mL/h via EPIDURAL
  Filled 2015-09-20 (×2): qty 125

## 2015-09-20 MED ORDER — LACTATED RINGERS IV SOLN
500.0000 mL | Freq: Once | INTRAVENOUS | Status: AC
  Administered 2015-09-20: 500 mL via INTRAVENOUS

## 2015-09-20 MED ORDER — DIPHENHYDRAMINE HCL 50 MG/ML IJ SOLN: 12.5000 mg | INTRAMUSCULAR | Status: DC | PRN

## 2015-09-20 MED ORDER — DIPHENHYDRAMINE HCL 25 MG PO CAPS: 25.0000 mg | ORAL_CAPSULE | Freq: Four times a day (QID) | ORAL | Status: DC | PRN

## 2015-09-20 MED ORDER — TETANUS-DIPHTH-ACELL PERTUSSIS 5-2.5-18.5 LF-MCG/0.5 IM SUSP
0.5000 mL | Freq: Once | INTRAMUSCULAR | Status: AC
  Administered 2015-09-22: 0.5 mL via INTRAMUSCULAR

## 2015-09-20 MED ORDER — SIMETHICONE 80 MG PO CHEW: 80.0000 mg | CHEWABLE_TABLET | ORAL | Status: DC | PRN

## 2015-09-20 MED ORDER — OXYCODONE HCL 5 MG PO TABS: 5.0000 mg | ORAL_TABLET | ORAL | Status: DC | PRN

## 2015-09-20 MED ORDER — PRENATAL MULTIVITAMIN CH
1.0000 | ORAL_TABLET | Freq: Every day | ORAL | Status: DC
  Administered 2015-09-21 – 2015-09-22 (×2): 1 via ORAL
  Filled 2015-09-20 (×2): qty 1

## 2015-09-20 MED ORDER — COCONUT OIL OIL: 1.0000 "application " | TOPICAL_OIL | Status: DC | PRN

## 2015-09-20 MED ORDER — ZOLPIDEM TARTRATE 5 MG PO TABS: 5.0000 mg | ORAL_TABLET | Freq: Every evening | ORAL | Status: DC | PRN

## 2015-09-20 MED ORDER — DIBUCAINE 1 % RE OINT: 1.0000 "application " | TOPICAL_OINTMENT | RECTAL | Status: DC | PRN

## 2015-09-20 MED ORDER — OXYCODONE HCL 5 MG PO TABS: 10.0000 mg | ORAL_TABLET | ORAL | Status: DC | PRN

## 2015-09-20 MED ORDER — BENZOCAINE-MENTHOL 20-0.5 % EX AERO: 1.0000 "application " | INHALATION_SPRAY | CUTANEOUS | Status: DC | PRN

## 2015-09-20 MED ORDER — DEXTROSE 5 % IV SOLN
1.0000 g | Freq: Two times a day (BID) | INTRAVENOUS | Status: DC
  Administered 2015-09-20 – 2015-09-21 (×2): 1 g via INTRAVENOUS
  Filled 2015-09-20 (×2): qty 1

## 2015-09-20 NOTE — Anesthesia Preprocedure Evaluation (Signed)
Anesthesia Evaluation  Patient identified by MRN, date of birth, ID band Patient awake    Reviewed: Allergy & Precautions, NPO status , Patient's Chart, lab work & pertinent test results  Airway Mallampati: II  TM Distance: >3 FB Neck ROM: Full    Dental  (+) Teeth Intact   Pulmonary neg pulmonary ROS,    breath sounds clear to auscultation       Cardiovascular negative cardio ROS   Rhythm:Regular Rate:Normal     Neuro/Psych negative neurological ROS  negative psych ROS   GI/Hepatic negative GI ROS, Neg liver ROS,   Endo/Other  negative endocrine ROS  Renal/GU negative Renal ROS  negative genitourinary   Musculoskeletal negative musculoskeletal ROS (+)   Abdominal   Peds negative pediatric ROS (+)  Hematology negative hematology ROS (+)   Anesthesia Other Findings   Reproductive/Obstetrics (+) Pregnancy                             Lab Results  Component Value Date   WBC 9.7 09/19/2015   HGB 11.2* 09/19/2015   HCT 33.4* 09/19/2015   MCV 85.6 09/19/2015   PLT 204 09/19/2015   No results found for: INR, PROTIME   Anesthesia Physical Anesthesia Plan  ASA: II  Anesthesia Plan: Epidural   Post-op Pain Management:    Induction:   Airway Management Planned:   Additional Equipment:   Intra-op Plan:   Post-operative Plan:   Informed Consent: I have reviewed the patients History and Physical, chart, labs and discussed the procedure including the risks, benefits and alternatives for the proposed anesthesia with the patient or authorized representative who has indicated his/her understanding and acceptance.     Plan Discussed with:   Anesthesia Plan Comments:         Anesthesia Quick Evaluation

## 2015-09-20 NOTE — Anesthesia Procedure Notes (Signed)
Epidural Patient location during procedure: OB Start time: 09/20/2015 1:50 AM End time: 09/20/2015 1:56 AM  Staffing Anesthesiologist: Shona SimpsonHOLLIS, Kathlyne Loud D Performed by: anesthesiologist   Preanesthetic Checklist Completed: patient identified, site marked, surgical consent, pre-op evaluation, timeout performed, IV checked, risks and benefits discussed and monitors and equipment checked  Epidural Patient position: sitting Prep: ChloraPrep Patient monitoring: heart rate, continuous pulse ox and blood pressure Approach: midline Location: L3-L4 Injection technique: LOR saline  Needle:  Needle type: Tuohy  Needle gauge: 17 G Needle length: 9 cm Catheter type: closed end flexible Catheter size: 20 Guage Test dose: negative and 1.5% lidocaine  Assessment Events: blood not aspirated, injection not painful, no injection resistance and no paresthesia  Additional Notes LOR @ 5  Patient identified. Risks/Benefits/Options discussed with patient including but not limited to bleeding, infection, nerve damage, paralysis, failed block, incomplete pain control, headache, blood pressure changes, nausea, vomiting, reactions to medications, itching and postpartum back pain. Confirmed with bedside nurse the patient's most recent platelet count. Confirmed with patient that they are not currently taking any anticoagulation, have any bleeding history or any family history of bleeding disorders. Patient expressed understanding and wished to proceed. All questions were answered. Sterile technique was used throughout the entire procedure. Please see nursing notes for vital signs. Test dose was given through epidural catheter and negative prior to continuing to dose epidural or start infusion. Warning signs of high block given to the patient including shortness of breath, tingling/numbness in hands, complete motor block, or any concerning symptoms with instructions to call for help. Patient was given instructions on  fall risk and not to get out of bed. All questions and concerns addressed with instructions to call with any issues or inadequate analgesia.    Reason for block:procedure for pain

## 2015-09-20 NOTE — H&P (Signed)
Jessica Barton is a 25 y.o. female G2P1001 at 36+ with contractions, cervical change since office visit.  Pt is uncomfortable.  Slow cervical change on L&D overnight.  Has received epidural for pain control.  Will not augment definitive, continued change.  Mother also varicella nonimmune.  Otherwise uncomplicated prenatal care.    Maternal Medical History:  Reason for admission: Contractions.   Contractions: Onset was 6-12 hours ago.   Frequency: regular.   Perceived severity is moderate.    Fetal activity: Perceived fetal activity is normal.    Prenatal Complications - Diabetes: none.    OB History    Gravida Para Term Preterm AB TAB SAB Ectopic Multiple Living   0 0 0 0 0 0 1    G1 SVD  G2 present  No STD No abn pap  Past Medical History  Diagnosis Date  . Glaucoma   . Glaucoma (increased eye pressure)   . Normal pregnancy in multigravida in third trimester 09/20/2015   Past Surgical History  Procedure Laterality Date  . Eye surgery    . Nose surgery     Family History: family history includes Diabetes in her father; Hyperlipidemia in her father; Hypertension in her father. Social History:  reports that she has never smoked. She has never used smokeless tobacco. She reports that she does not drink alcohol or use illicit drugs.SAHM, married Meds PNV All NKDA   Prenatal Transfer Tool  Maternal Diabetes: No Genetic Screening: Normal Maternal Ultrasounds/Referrals: Normal Fetal Ultrasounds or other Referrals:  None Maternal Substance Abuse:  No Significant Maternal Medications:  None Significant Maternal Lab Results:  Lab values include: Group B Strep negative Other Comments:  None  Review of Systems  Constitutional: Negative.   HENT: Negative.   Eyes: Negative.   Respiratory: Negative.   Cardiovascular: Negative.   Gastrointestinal: Negative.   Genitourinary: Negative.   Musculoskeletal: Negative.   Skin: Negative.   Neurological: Negative.    Psychiatric/Behavioral: Negative.     Dilation: 4 Effacement (%): 20 Station: -2 Exam by:: Jessica Millard, RN Blood pressure 110/57, pulse 63, temperature 98.3 F (36.8 C), temperature source Oral, resp. rate 18, height  (1.626 m), weight 88.905 kg (196 lb), last menstrual period 01/07/2015, SpO2 99 %, unknown if currently breastfeeding. Maternal Exam:  Uterine Assessment: Contraction strength is moderate.  Contraction frequency is regular.   Abdomen: Patient reports no abdominal tenderness. Fundal height is appropriate for gestatiob.   Estimated fetal weight is 7.5-8#.   Fetal presentation: vertex  Introitus: Normal vulva. Normal vagina.  Pelvis: adequate for delivery.   Cervix: Cervix evaluated by digital exam.     Physical Exam  Constitutional: She is oriented to person, place, and time. She appears well-developed and well-nourished.  HENT:  Head: Normocephalic and atraumatic.  Cardiovascular: Normal rate and regular rhythm.   Respiratory: Effort normal and breath sounds normal. No respiratory distress. She has no wheezes.  GI: Soft. Bowel sounds are normal. She exhibits no distension. There is no tenderness.  Musculoskeletal: Normal range of motion.  Neurological: She is alert and oriented to person, place, and time.  Skin: Skin is warm and dry.  Psychiatric: She has a normal mood and affect. Her behavior is normal.    Prenatal labs: ABO, Rh: --/--/O POS (06/16 2139) Antibody: NEG (06/16 2139) Rubella: Immune (12/06 0000) RPR: Nonreactive (12/06 0000)  HBsAg: Negative (12/06 0000)  HIV: Non-reactive (12/06 0000)  GBS: Negative (06/07 0000)   Hgb 13.1/Plt 260K/Ur Cx  neg/GC neg/Chl neg/Varicella nonimmune/Hgb electro WNL/Pap WNL/glucola 91/  EDC by LMP cw First tri US Nl NT Nl anat, ant plac, female  Assessment/Plan: 24yo G2P1001 at 36+ in latent labor Epidural for comfort gbbs neg Will augment if gets for actve  Jessica Barton, Jessica Barton 09/20/2015, 5:46  AM

## 2015-09-20 NOTE — Progress Notes (Signed)
Patient ID: Jessica Barton, female   DOB: 18-Apr-1990, 25 y.o.   MRN: [id number]  Pt slowly changing.  D/W pt POC - not to augment until more active, comfortable with epidural  AFVSS gen NAD FHTs 105-130's, good var, category 1, now 110. toco Q 4min SVE 4.5  Continue current mgmt

## 2015-09-20 NOTE — Progress Notes (Signed)
Patient ID: Jessica Barton, female   DOB: March 17, 1991, 25 y.o.   MRN: [id number]  D/w pt at length 36 week pregnancy and not augmenting delivery, RN present.  FHTs 110's good var, category 1 toco Q 3-905min  Will recheck at noon.   Continue current mgmt

## 2015-09-20 NOTE — Anesthesia Pain Management Evaluation Note (Signed)
  CRNA Pain Management Visit Note  Patient: Jessica GrillNadeen H Eifler, 25 y.o., female  "Hello I am a member of the anesthesia team at [facility]. We have an anesthesia team available at all times to provide care throughout the hospital, including epidural management and anesthesia for C-section. I don't know your plan for the delivery whether it a natural birth, water birth, IV sedation, nitrous supplementation, doula or epidural, but we want to meet your pain goals."   1.Was your pain managed to your expectations on prior hospitalizations?   Yes   2.What is your expectation for pain management during this hospitalization?     Epidural  3.How can we help you reach that goal? Epidural  Record the patient's initial score and the patient's pain goal.   Pain: 0  Pain Goal: 8 [facility] wants you to be able to say your pain was always managed very well.  Cephus ShellingBURGER,Kyen Taite 09/20/2015

## 2015-09-21 LAB — CBC
HCT: 30.8 % — ABNORMAL LOW (ref 36.0–46.0)
Hemoglobin: 10.3 g/dL — ABNORMAL LOW (ref 12.0–15.0)
MCH: 28.9 pg (ref 26.0–34.0)
MCHC: 33.4 g/dL (ref 30.0–36.0)
MCV: 86.5 fL (ref 78.0–100.0)
PLATELETS: 184 10*3/uL (ref 150–400)
RBC: 3.56 MIL/uL — AB (ref 3.87–5.11)
RDW: 14.3 % (ref 11.5–15.5)
WBC: 12.6 10*3/uL — ABNORMAL HIGH (ref 4.0–10.5)

## 2015-09-21 NOTE — Lactation Note (Signed)
This note was copied from a baby's chart. Lactation Consultation Note  Patient Name: Jessica Barton OZHYQ'MToday's Date: 09/21/2015 Reason for consult: Initial assessment Visited with MOB, infant 22 hrs old.  NICU called for code apgar at 5 minutes of life.  Baby needed supplemental O2 and suctioning.  Delivery was precipitous, and baby born at 7349w4d, weighing 5 lbs 4 oz.  This is Mom's second child, she didn't BF her first as "baby wouldn't latch".  Baby sleeping in crib, and Mom trying to rest.  Recommended she call at next feeding, and LC will assist.  Reassured Mom that baby was acting normal for a 36 weeker.  Encouraged keeping baby skin to skin when she isn't trying to sleep.  Encouraged feeding baby at least every 3 hrs, and offering supplement per LPI guideline after breastfeeding.  Pump set up at bedside, and encouraged to pump after feedings for full 15 minutes on the Initiation setting.  Mom worried about not having milk.  Talked about normal progression of milk volume, and advantages to manual breast expression along with pumping. Brochure and LPI handout given.  Informed Mom of IP and OP lactation support available. To follow up prn today, and tomorrow am.    Jessica Barton, Jessica Barton 09/21/2015, 11:05 AM

## 2015-09-21 NOTE — Progress Notes (Signed)
Post Partum Day 1 Subjective: no complaints, up ad lib, voiding, tolerating PO and nl lochia, pain controlled  Objective: Blood pressure 151/62, pulse 85, temperature 98.1 F (36.7 C), temperature source Oral, resp. rate 18, height 5\' 4"  (1.626 m), weight 88.905 kg (196 lb), last menstrual period 01/07/2015, SpO2 98 %, unknown if currently breastfeeding.  Physical Exam:  General: alert and no distress Lochia: appropriate Uterine Fundus: firm   Recent Labs  09/19/15 2141 09/21/15 0529  HGB 11.2* 10.3*  HCT 33.4* 30.8*    Assessment/Plan: Plan for discharge tomorrow, Breastfeeding and Lactation consult.  Routine care.     LOS: 2 days   [name], Nadea Kirkland 09/21/2015, 8:19 AM

## 2015-09-21 NOTE — Lactation Note (Signed)
This note was copied from a baby's chart. Lactation Consultation Note  Patient Name: Jessica Barton ZOXWR'UToday's Date: 09/21/2015 Reason for consult: Follow-up assessment;Infant < 6lbs;Late preterm infant   Assistance given with positioning and latching baby.  Baby 23 hrs old.  Mom did not want to do football hold, so assisted with cross cradle hold.  With breast compression, baby was able to latch fairly deeply past nipple.  Mom has large diameter nipples which makes it a challenge to latch baby deeply onto breast.  Baby fed with some stimulation needed for 15 minutes on left breast.  Mom switched onto right breast, and independently latched baby herself.  Mom feeling uterine cramping during feeding.  Baby to feed a maximum of 30 minutes so not to tire.  Mom instructed to continue with 10-20 ml supplementation (Neosure due to religion) by finger feeding with curved tip syringe.  Talked about using a slow flow bottle, but Mom comfortable with syringe right now.  Mom to pump after breast feeding as well.  Praised Mom for doing such a great job latching and caring for her baby.  Mom seemed very pleased.  To ask for help prn and LC to follow up in am 6/19.    Maternal Data Has patient been taught Hand Expression?: Yes Does the patient have breastfeeding experience prior to this delivery?: No  Feeding Feeding Type: Breast Fed  LATCH Score/Interventions Latch: Grasps breast easily, tongue down, lips flanged, rhythmical sucking. Intervention(s): Breast compression;Breast massage;Assist with latch;Adjust position  Audible Swallowing: A few with stimulation Intervention(s): Alternate breast massage;Hand expression;Skin to skin  Type of Nipple: Everted at rest and after stimulation  Comfort (Breast/Nipple): Soft / non-tender     Hold (Positioning): Assistance needed to correctly position infant at breast and maintain latch. Intervention(s): Breastfeeding basics reviewed;Support Pillows;Position  options;Skin to skin  LATCH Score: 8  Lactation Tools Discussed/Used WIC Program: Yes Pump Review: Setup, frequency, and cleaning;Milk Storage Initiated by:: MBU RN Date initiated:: 09/20/15   Consult Status Consult Status: Follow-up Date: 09/22/15 Follow-up type: In-patient    [name], [name] 09/21/2015, 12:39 PM

## 2015-09-21 NOTE — Anesthesia Postprocedure Evaluation (Signed)
Anesthesia Post Note  Patient: Jessica Barton  Procedure(s) Performed: * No procedures listed *  Patient location during evaluation: Mother Baby Anesthesia Type: Epidural Level of consciousness: awake and alert, oriented and patient cooperative Pain management: pain level controlled Vital Signs Assessment: post-procedure vital signs reviewed and stable Respiratory status: spontaneous breathing Cardiovascular status: stable Postop Assessment: no headache, epidural receding, patient able to bend at knees and no signs of nausea or vomiting Anesthetic complications: no Comments: Pain score 1.     Last Vitals:  Filed Vitals:   09/20/15 1950 09/21/15 0600  BP: 118/57 151/62  Pulse: 103 85  Temp: 37.6 C 36.7 C  Resp: 18 18    Last Pain:  Filed Vitals:   09/21/15 0905  PainSc: 0-No pain   Pain Goal:                 [facility] PennsylvaniaWRINKLE,Delailah Spieth

## 2015-09-22 MED ORDER — IBUPROFEN 600 MG PO TABS: 600.0000 mg | ORAL_TABLET | Freq: Four times a day (QID) | ORAL | Status: DC

## 2015-09-22 NOTE — Discharge Summary (Signed)
OB Discharge Summary     Patient Name: Jessica Barton DOB: 1990/06/29 MRN: [id number]  Date of admission: 09/19/2015 Delivering MD: Sherian ReinBOVARD-STUCKERT, JODY   Date of discharge: 09/22/2015  Admitting diagnosis: 36 weeks ctx Intrauterine pregnancy: 5576w4d     Secondary diagnosis:  Principal Problem:   Preterm delivery Active Problems:   Normal labor   Normal pregnancy in multigravida in third trimester  Additional problems: preterm labor at 36+ weeks     Discharge diagnosis: Preterm Pregnancy Delivered                                                                                                Post partum procedures:manual extraction of placenta  Augmentation: none  Complications: None  Hospital course:  Onset of Labor With Vaginal Delivery     25 y.o. yo [id number] at 6476w4d was admitted in Latent Labor on 09/19/2015. Patient had an uncomplicated labor course as follows:  Membrane Rupture Time/Date: 12:35 PM ,09/20/2015   Intrapartum Procedures: Episiotomy: None [1]                                         Lacerations:  2nd degree [3];Perineal [11]  Patient had a delivery of a Viable infant. 09/20/2015  Information for the patient's newborn:  Philomena Dohenysa, Girl Janani [[id number]][[id number]]  Delivery Method: Vaginal, Spontaneous Delivery (Filed from Delivery Summary)    Pateint had an uncomplicated postpartum course.  She is ambulating, tolerating a regular diet, passing flatus, and urinating well. Patient is discharged home in stable condition on 09/22/2015.    Physical exam  Filed Vitals:   09/20/15 1950 09/21/15 0600 09/21/15 1747 09/22/15 0600  BP: 118/57 151/62 122/67 137/83  Pulse: 103 85 90 80  Temp: 99.6 F (37.6 C) 98.1 F (36.7 C) 99 F (37.2 C) 98 F (36.7 C)  TempSrc: Oral Oral Oral Oral  Resp: 18 18 18 18   Height:      Weight:      SpO2: 98%  97%    General: alert and cooperative Lochia: appropriate Uterine Fundus: firm  Labs: Lab Results  Component Value Date   WBC  12.6* 09/21/2015   HGB 10.3* 09/21/2015   HCT 30.8* 09/21/2015   MCV 86.5 09/21/2015   PLT 184 09/21/2015   CMP Latest Ref Rng 06/30/2012  Glucose 70 - 99 mg/dL 91  BUN 6 - 23 mg/dL 9  Creatinine 6.960.50 - 2.951.10 mg/dL 2.840.55  Sodium 132135 - 440145 mEq/L 136  Potassium 3.5 - 5.1 mEq/L 3.7  Chloride 96 - 112 mEq/L 100  CO2 19 - 32 mEq/L 25  Calcium 8.4 - 10.5 mg/dL 9.9  Total Protein 6.0 - 8.3 g/dL 7.7  Total Bilirubin 0.3 - 1.2 mg/dL 1.0(U0.2(L)  Alkaline Phos 39 - 117 U/L 62  AST 0 - 37 U/L 16  ALT 0 - 35 U/L 13    Discharge instruction: per After Visit Summary and "Baby and Me Booklet".  After visit meds:    Medication List  ASK your doctor about these medications        prenatal multivitamin Tabs tablet  Take 1 tablet by mouth daily at 12 noon.        Diet: routine diet  Activity: Advance as tolerated. Pelvic rest for 6 weeks.   Outpatient follow up:2 weeks Follow up Appt:No future appointments. Follow up Visit:No Follow-up on file.  Postpartum contraception: Undecided  Newborn Data: Live born female  Birth Weight: 5 lb 4 oz (2380 g) APGAR: 8, 2  Baby Feeding: Breast Disposition:rooming in   09/22/2015 Oliver Pila, MD

## 2015-09-22 NOTE — Progress Notes (Signed)
Post Partum Day 2 Subjective: no complaints and tolerating PO  Working on breastfeeding, was told baby will stay another day and she will room in given early gestational age  Objective: Blood pressure 137/83, pulse 80, temperature 98 F (36.7 C), temperature source Oral, resp. rate 18, height 5\' 4"  (1.626 m), weight 196 lb (88.905 kg), last menstrual period 01/07/2015, SpO2 97 %, unknown if currently breastfeeding.  Physical Exam:  General: alert and cooperative Lochia: appropriate Uterine Fundus: firm    Recent Labs  09/19/15 2141 09/21/15 0529  HGB 11.2* 10.3*  HCT 33.4* 30.8*    Assessment/Plan: Discharge home, will room in with baby. Explained will need to get her own meds to take.   LOS: 3 days   Jaylnn Ullery W 09/22/2015, 9:54 AM

## 2015-09-22 NOTE — Lactation Note (Signed)
This note was copied from a baby's chart. Lactation Consultation Note  Mother reports that she and the RNs tried to get the baby to feed at the breast at 1100 but that the baby was too sleepy.  She is currently pumping and plans to feed the expressed BM to the baby.  She inquired as to how to feed the baby increased volumes as the baby is not almost 48 hours of life and does not think the syringe is large enough at this point. Offered cup feeding but mom did not think she could do this when she was alone. Artificial nipple was initiated with the risks explained. Paced bottle feeding was explained and demonstrated to the mother with return demonstration.  Mother understands that if the nipple interferes with latching she should stop using it and ask for further guidance.  She formula fed her first child because baby would not latch and patient did not want to pump.  She does not like pumping but has agreed to continue for at least a week. Follow=up tomorrow. Patient Name: Jessica Barton AVWUJ'WToday's Date: 09/22/2015 Reason for consult: Follow-up assessment   Maternal Data    Feeding Feeding Type: Breast Milk Length of feed: 5 min  LATCH Score/Interventions                      Lactation Tools Discussed/Used     Consult Status Consult Status: Follow-up Date: 09/23/15 Follow-up type: In-patient    Jessica Barton, Jessica Barton 09/22/2015, 11:45 AM

## 2015-09-23 ENCOUNTER — Ambulatory Visit: Payer: Self-pay

## 2015-09-23 NOTE — Lactation Note (Signed)
This note was copied from a baby's chart. Lactation Consultation Note; Mom is just finishing bottle feeding EBM to baby as I went into room. Reports baby nursed for 25 min but still acts hungry after nursing. Reviewed late preterm behavior. Mom concerned that baby always acts hungry after nursing. States she does not like pumping but will do it for now. Asking when baby will be only breast feeding. Encouragement given that she is giving breast milk to her baby. [facility] has WIC [facility]. Paperwork given to mom- will follow up before DC.   Patient Name: Jessica Crist Fatadeen Witcher ONGEX'BToday's Date: 09/23/2015 Reason for consult: Follow-up assessment;Infant < 6lbs;Late preterm infant   Maternal Data Formula Feeding for Exclusion: Yes Reason for exclusion: Mother's choice to formula and breast feed on admission Does the patient have breastfeeding experience prior to this delivery?: No  Feeding Feeding Type: Bottle Fed - Breast Milk Nipple Type: Slow - flow Length of feed: 25 min  LATCH Score/Interventions                      Lactation Tools Discussed/Used WIC Program: Yes Initiated by:: RN   Consult Status Consult Status: Follow-up Date: 09/23/15 Follow-up type: In-patient    Pamelia HoitWeeks, Sanel Stemmer D 09/23/2015, 8:58 AM

## 2015-09-23 NOTE — Lactation Note (Signed)
This note was copied from a baby's chart. Lactation Consultation Note; [facility] No questions at present. To call prn  Patient Name: Jessica Barton ZOXWR'UToday's Date: 09/23/2015 Reason for consult: Follow-up assessment;Infant < 6lbs;Late preterm infant   Maternal Data   Feeding   LATCH Score/Interventions                      Lactation Tools Discussed/Used WIC Program: Yes Initiated by:: RN   Consult Status Complete    Pamelia HoitWeeks, [name] D 09/23/2015, 11:38 AM

## 2015-09-23 NOTE — Progress Notes (Signed)
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  When CSW arrived, Jessica Barton and FOB were with bedside nursing preparing for baby's discharge.  CSW inquired about Jessica Barton's supports, and Jessica Barton communicated that she gets support from FOB and her mother.  FOB communicated that Jessica Barton's mother will be visiting regularly to assist Jessica Barton with caring for the children.  Jessica Barton acknowledged that she experienced PPD with her first child, and was able to communicated symptoms.  CSW educated Jessica Barton about other symptoms and normalized crying and feelings of being overwhelmed.  CSW informed Jessica Barton of possible supports and interventions to decrease PPD. CSW also encouraged Jessica Barton to seek medical attention if needed for increased signs and symptoms for PPD. CSW offered outpatient counseling for PPD to Jessica Barton and Jessica Barton declined.  Jessica Barton stated that she feels better after this delivery because she has an idea to what expect.  CSW thanked Jessica Barton for meeting with her and encouraged Jessica Barton to call back and ask for the social worker, if she had any questions or concerns. 

## 2016-03-19 ENCOUNTER — Emergency Department (HOSPITAL_COMMUNITY)
Admission: EM | Admit: 2016-03-19 | Discharge: 2016-03-19 | Disposition: A | Discharge status: Home / Self Care | Payer: Medicaid Other | Attending: Emergency Medicine | Admitting: Emergency Medicine

## 2016-03-19 DIAGNOSIS — J029 Acute pharyngitis, unspecified: Secondary | ICD-10-CM | POA: Insufficient documentation

## 2016-03-19 LAB — RAPID STREP SCREEN (MED CTR MEBANE ONLY): STREPTOCOCCUS, GROUP A SCREEN (DIRECT): NEGATIVE

## 2016-03-19 MED ORDER — CEPHALEXIN 500 MG PO CAPS: 500.0000 mg | ORAL_CAPSULE | Freq: Two times a day (BID) | ORAL | 0 refills | Status: AC

## 2016-03-19 MED ORDER — PREDNISONE 10 MG (21) PO TBPK: 10.0000 mg | ORAL_TABLET | Freq: Every day | ORAL | 0 refills | Status: DC

## 2016-03-19 NOTE — ED Triage Notes (Signed)
sorethroat x 2 weeks and a cough and fever

## 2016-03-19 NOTE — Discharge Instructions (Signed)
Please take all of your antibiotics until finished!   You may develop abdominal discomfort or diarrhea from the antibiotic.  You may help offset this with probiotics which you can buy or get in yogurt. Do not eat or take the probiotics until 2 hours after your antibiotic.  Drink plenty of fluids and get plenty of rest. You should be drinking at least a one half to one liter of water an hour to stay hydrated. Ibuprofen, Naproxen, or Tylenol for pain or fever. Warm liquids or Chloraseptic spray may help soothe a sore throat. Follow up with a primary care provider, as needed, for any future management of this issue.

## 2016-03-19 NOTE — ED Provider Notes (Signed)
[facility]-EMERGENCY DEPT Provider Note   CSN: [id number] Arrival date & time: 03/19/16  [id number]  By signing my name below, I, Teofilo PodMatthew P. Jamison, attest that this documentation has been prepared under the direction and in the presence of Shawn Joy, PA-C. Electronically Signed: Teofilo PodMatthew P. Jamison, ED Scribe. 03/19/2016. 10:45 AM.    History   Chief Complaint Chief Complaint  Patient presents with  . Sore Throat   The history is provided by the patient. No language interpreter was used.   HPI Comments:  Jessica Barton is a 25 y.o. female who presents to the Emergency Department complaining of a constant sore throat x2 week. Pt reports that her throat is painful bilaterally. Pt complains of associated intermittent, improving cough, and fever of 102 that has since resolved. Pt has been taking Advil and Tylenol with mild temporary relief. Denies difficulty breathing or swallowing, nausea/vomiting, rashes, or any other complaints.       Past Medical History:  Diagnosis Date  . Glaucoma   . Glaucoma (increased eye pressure)   . Normal pregnancy in multigravida in third trimester 09/20/2015  . Preterm delivery 09/20/2015    Patient Active Problem List   Diagnosis Date Noted  . Normal pregnancy in multigravida in third trimester 09/20/2015  . Preterm delivery 09/20/2015  . Normal labor 09/19/2015  . Other specified indication for care or intervention related to labor and delivery, unspecified as to episode of care 10/19/2013  . Third degree laceration of perineum during delivery, postpartum 10/19/2013  . Supervision of normal pregnancy 08/09/2013  . Uterine size date discrepancy, antepartum 07/19/2013    Past Surgical History:  Procedure Laterality Date  . EYE SURGERY    . NOSE SURGERY      OB History    Gravida Para Term Preterm AB Living   2 2 1 1  0 2   SAB TAB Ectopic Multiple Live Births   0 0 0 0 2       Home Medications    Prior to Admission medications   Medication  Sig Start Date End Date Taking? Authorizing Provider  cephALEXin (KEFLEX) 500 MG capsule Take 1 capsule (500 mg total) by mouth 2 (two) times daily. 03/19/16 03/29/16  Shawn C Joy, PA-C  ibuprofen (ADVIL,MOTRIN) 600 MG tablet Take 1 tablet (600 mg total) by mouth every 6 (six) hours. 09/22/15   Huel CoteKathy Richardson, MD  predniSONE (STERAPRED UNI-PAK 21 TAB) 10 MG (21) TBPK tablet Take 1 tablet (10 mg total) by mouth daily. Take 6 tabs day 1, 5 tabs day 2, 4 tabs day 3, 3 tabs day 4, 2 tabs day 5, and 1 tab on day 6. 03/19/16   Shawn C Joy, PA-C  Prenatal Vit-Fe Fumarate-FA (PRENATAL MULTIVITAMIN) TABS tablet Take 1 tablet by mouth daily at 12 noon.    Historical Provider, MD    Family History Family History  Problem Relation Age of Onset  . Diabetes Father   . Hypertension Father   . Hyperlipidemia Father     Social History Social History  Substance Use Topics  . Smoking status: Never Smoker  . Smokeless tobacco: Never Used  . Alcohol use No     Allergies   Penicillins   Review of Systems Review of Systems  HENT: Positive for sore throat. Negative for ear pain, trouble swallowing and voice change.   Respiratory: Positive for cough. Negative for shortness of breath.   Cardiovascular: Negative for chest pain.  All other systems reviewed and are negative.  Physical Exam Updated Vital Signs BP 125/93 (BP Location: Left Arm)   Pulse 90   Temp 98.7 F (37.1 C) (Oral)   Resp 14   Wt 182 lb (82.6 kg)   SpO2 98%   BMI 31.24 kg/m   Physical Exam  Constitutional: She appears well-developed and well-nourished. No distress.  HENT:  Head: Normocephalic and atraumatic.  Mouth/Throat: Posterior oropharyngeal edema and posterior oropharyngeal erythema present.  Eyes: Conjunctivae are normal.  Neck: Normal range of motion. Neck supple.  Cardiovascular: Normal rate, regular rhythm, normal heart sounds and intact distal pulses.   Pulmonary/Chest: Effort normal and breath sounds  normal. No respiratory distress.  Abdominal: There is no guarding.  Musculoskeletal: She exhibits no edema.  Lymphadenopathy:    She has no cervical adenopathy.  Neurological: She is alert.  Skin: Skin is warm and dry. She is not diaphoretic.  Psychiatric: She has a normal mood and affect. Her behavior is normal.  Nursing note and vitals reviewed.    ED Treatments / Results  DIAGNOSTIC STUDIES:  Oxygen Saturation is 98% on RA, normal by my interpretation.    COORDINATION OF CARE:  10:45 AM Despite negative strep test, patient was prescribed antibiotics due to duration of symptoms. Discussed treatment plan with pt at bedside and pt agreed to plan.   Labs (all labs ordered are listed, but only abnormal results are displayed) Labs Reviewed  RAPID STREP SCREEN (NOT AT [facility])  CULTURE, GROUP A STREP [facility])    EKG  EKG Interpretation None       Radiology No results found.  Procedures Procedures (including critical care time)  Medications Ordered in ED Medications - No data to display   Initial Impression / Assessment and Plan / ED Course  I have reviewed the triage vital signs and the nursing notes.  Pertinent labs & imaging results that were available during my care of the patient were reviewed by me and considered in my medical decision making (see chart for details).  Clinical Course     Patient presents with pharyngitis. Patient is nontoxic appearing. Antibiotics prescribed due to duration of symptoms. Further home care and return precautions discussed.      Final Clinical Impressions(s) / ED Diagnoses   Final diagnoses:  Pharyngitis, unspecified etiology    New Prescriptions Discharge Medication List as of 03/19/2016 10:50 AM    START taking these medications   Details  cephALEXin (KEFLEX) 500 MG capsule Take 1 capsule (500 mg total) by mouth 2 (two) times daily., Starting Fri 03/19/2016, Until Mon 03/29/2016, Print    predniSONE (STERAPRED  UNI-PAK 21 TAB) 10 MG (21) TBPK tablet Take 1 tablet (10 mg total) by mouth daily. Take 6 tabs day 1, 5 tabs day 2, 4 tabs day 3, 3 tabs day 4, 2 tabs day 5, and 1 tab on day 6., Starting Fri 03/19/2016, Print       I personally performed the services described in this documentation, which was scribed in my presence. The recorded information has been reviewed and is accurate.    Anselm PancoastShawn C Joy, PA-C 03/19/16 1625    Vanetta MuldersScott Zackowski, MD 03/20/16 2326

## 2016-03-21 LAB — CULTURE, GROUP A STREP (THRC)

## 2017-11-25 ENCOUNTER — Emergency Department (HOSPITAL_COMMUNITY)
Admission: EM | Admit: 2017-11-25 | Discharge: 2017-11-25 | Discharge status: Against Medical Advice | Payer: Self-pay | Attending: Emergency Medicine | Admitting: Emergency Medicine

## 2017-11-25 ENCOUNTER — Encounter (HOSPITAL_COMMUNITY): Payer: Self-pay | Admitting: Emergency Medicine

## 2017-11-25 ENCOUNTER — Other Ambulatory Visit: Payer: Self-pay

## 2017-11-25 DIAGNOSIS — H53149 Visual discomfort, unspecified: Secondary | ICD-10-CM | POA: Insufficient documentation

## 2017-11-25 DIAGNOSIS — R519 Headache, unspecified: Secondary | ICD-10-CM

## 2017-11-25 DIAGNOSIS — R51 Headache: Secondary | ICD-10-CM | POA: Insufficient documentation

## 2017-11-25 MED ORDER — DEXAMETHASONE SODIUM PHOSPHATE 10 MG/ML IJ SOLN
10.0000 mg | Freq: Once | INTRAMUSCULAR | Status: AC
  Administered 2017-11-25: 10 mg via INTRAVENOUS
  Filled 2017-11-25: qty 1

## 2017-11-25 MED ORDER — SODIUM CHLORIDE 0.9 % IV BOLUS
1000.0000 mL | Freq: Once | INTRAVENOUS | Status: AC
  Administered 2017-11-25: 1000 mL via INTRAVENOUS

## 2017-11-25 MED ORDER — DIPHENHYDRAMINE HCL 50 MG/ML IJ SOLN
25.0000 mg | Freq: Once | INTRAMUSCULAR | Status: AC
  Administered 2017-11-25: 25 mg via INTRAVENOUS
  Filled 2017-11-25: qty 1

## 2017-11-25 MED ORDER — PROCHLORPERAZINE EDISYLATE 10 MG/2ML IJ SOLN
10.0000 mg | Freq: Once | INTRAMUSCULAR | Status: AC
  Administered 2017-11-25: 10 mg via INTRAVENOUS
  Filled 2017-11-25: qty 2

## 2017-11-25 NOTE — ED Provider Notes (Signed)
[facility] Provider Note   CSN: [id number] Arrival date & time: 11/25/17  1756     History   Chief Complaint Chief Complaint  Patient presents with  . Headache    HPI Jessica [facility] is a 27 y.o. female.  The history is provided by the patient and medical records. No language interpreter was used.  Headache   This is a new problem. The current episode started more than 1 week ago. The problem occurs constantly. The problem has not changed since onset.The headache is associated with bright light and loud noise. The pain is located in the frontal region. The quality of the pain is described as dull. The pain is moderate. The pain does not radiate. Pertinent negatives include no fever, no chest pressure, no near-syncope, no palpitations, no syncope, no shortness of breath, no nausea and no vomiting. She has tried acetaminophen for the symptoms. The treatment provided mild relief.    Past Medical History:  Diagnosis Date  . Glaucoma   . Glaucoma (increased eye pressure)   . Normal pregnancy in multigravida in third trimester 09/20/2015  . Preterm delivery 09/20/2015    Patient Active Problem List   Diagnosis Date Noted  . Normal pregnancy in multigravida in third trimester 09/20/2015  . Preterm delivery 09/20/2015  . Normal labor 09/19/2015  . Other specified indication for care or intervention related to labor and delivery, unspecified as to episode of care 10/19/2013  . Third degree laceration of perineum during delivery, postpartum 10/19/2013  . Supervision of normal pregnancy 08/09/2013  . Uterine size date discrepancy, antepartum 07/19/2013    Past Surgical History:  Procedure Laterality Date  . EYE SURGERY    . NOSE SURGERY       OB History    Gravida  2   Para  2   Term  1   Preterm  1   AB  0   Living  2     SAB  0   TAB  0   Ectopic  0   Multiple  0   Live Births  2            Home Medications     Prior to Admission medications   Medication Sig Start Date End Date Taking? Authorizing Provider  ibuprofen (ADVIL,MOTRIN) 600 MG tablet Take 1 tablet (600 mg total) by mouth every 6 (six) hours. 09/22/15   Huel Coteichardson, Kathy, MD  predniSONE (STERAPRED UNI-PAK 21 TAB) 10 MG (21) TBPK tablet Take 1 tablet (10 mg total) by mouth daily. Take 6 tabs day 1, 5 tabs day 2, 4 tabs day 3, 3 tabs day 4, 2 tabs day 5, and 1 tab on day 6. 03/19/16   Joy, Shawn C, PA-C  Prenatal Vit-Fe Fumarate-FA (PRENATAL MULTIVITAMIN) TABS tablet Take 1 tablet by mouth daily at 12 noon.    [provider]    Family History Family History  Problem Relation Age of Onset  . Diabetes Father   . Hypertension Father   . Hyperlipidemia Father     Social History Social History   Tobacco Use  . Smoking status: Never Smoker  . Smokeless tobacco: Never Used  Substance Use Topics  . Alcohol use: No  . Drug use: No     Allergies   Penicillins   Review of Systems Review of Systems  Constitutional: Negative for chills, diaphoresis, fatigue and fever.  HENT: Negative for congestion.   Eyes: Positive for photophobia. Negative  for visual disturbance.  Respiratory: Negative for cough, chest tightness, shortness of breath and wheezing.   Cardiovascular: Negative for chest pain, palpitations, syncope and near-syncope.  Gastrointestinal: Negative for abdominal pain, nausea and vomiting.  Genitourinary: Negative for flank pain and frequency.  Musculoskeletal: Negative for back pain, neck pain and neck stiffness.  Skin: Negative for rash and wound.  Neurological: Positive for headaches. Negative for dizziness, tremors, seizures, syncope, facial asymmetry, weakness, light-headedness and numbness.  Psychiatric/Behavioral: Negative for agitation.  All other systems reviewed and are negative.    Physical Exam Updated Vital Signs BP 119/73 (BP Location: Right Arm)   Pulse 76   Temp 98.9 F (37.2 C) (Oral)    Resp 16   Ht 5\' 4"  (1.626 m)   Wt 74.8 kg   SpO2 100%   BMI 28.32 kg/m   Physical Exam  Constitutional: She is oriented to person, place, and time. She appears well-developed and well-nourished.  Non-toxic appearance. She does not appear ill. No distress.  HENT:  Head: Normocephalic and atraumatic.  Mouth/Throat: Oropharynx is clear and moist. No oropharyngeal exudate.  Eyes: Pupils are equal, round, and reactive to light. Conjunctivae and EOM are normal.  Neck: Normal range of motion. Neck supple.  Cardiovascular: Normal rate and regular rhythm.  No murmur heard. Pulmonary/Chest: Effort normal and breath sounds normal. No respiratory distress.  Abdominal: Soft. There is no tenderness.  Musculoskeletal: She exhibits no edema or tenderness.  Lymphadenopathy:    She has no cervical adenopathy.  Neurological: She is alert and oriented to person, place, and time. She displays normal reflexes. No cranial nerve deficit or sensory deficit. She exhibits normal muscle tone. Coordination normal.  Skin: Skin is warm and dry. Capillary refill takes less than 2 seconds. No rash noted. She is not diaphoretic. No erythema.  Psychiatric: She has a normal mood and affect.  Nursing note and vitals reviewed.    ED Treatments / Results  Labs (all labs ordered are listed, but only abnormal results are displayed) Labs Reviewed - No data to display  EKG None  Radiology No results found.  Procedures Procedures (including critical care time)  Medications Ordered in ED Medications  sodium chloride 0.9 % bolus 1,000 mL (1,000 mLs Intravenous New Bag/Given 11/25/17 2218)  prochlorperazine (COMPAZINE) injection 10 mg (10 mg Intravenous Given 11/25/17 2218)  diphenhydrAMINE (BENADRYL) injection 25 mg (25 mg Intravenous Given 11/25/17 2221)  dexamethasone (DECADRON) injection 10 mg (10 mg Intravenous Given 11/25/17 2222)     Initial Impression / Assessment and Plan / ED Course  I have reviewed the  triage vital signs and the nursing notes.  Pertinent labs & imaging results that were available during my care of the patient were reviewed by me and considered in my medical decision making (see chart for details).     Jessica [facility] is a 27 y.o. female with a past medical history significant for glaucoma who presents with headache.  Patient reports for the last week she has had headaches.  She reports associated photophobia and phonophobia.  No neck pain or neck stiffness.  No fevers or chills.  No vision changes.  No recent traumatic injuries.  No sick contacts.  She denies any other complaints.  On exam, patient had no neck stiffness or rigidity.  Patient had normal neurologic exam.  Patient's left pupil is irregular which she reports is due to glaucoma.  It is unchanged from her baseline by report.  Patient had clear lungs and nontender chest.  Exam was completely unremarkable.  Clinically I suspect patient is having a migraine headache.  Patient was offered and accepted a headache cocktail.  Patient reports that there is no chance she is pregnant.  She does not have any concerning findings for meningitis, malignancy, or a prolonged headache concerning for a venous sinus thrombosis.  No trauma to concern for concussion or intracranial hemorrhage.    Patient will be given a headache cocktail and reassess.  Anticipate patient will be stable for discharge home with a prescription for Compazine to continue her headache management at home.  Anticipate she will be stable for discharge home after reassessment.  Care transferred to oncoming team while awaiting reassessment.  Final Clinical Impressions(s) / ED Diagnoses   Final diagnoses:  Bad headache    ED Discharge Orders    None     Clinical Impression: 1. Bad headache     Disposition: Awaiting reassessment after headache cocktail.  This note was prepared with assistance of Conservation officer, historic buildings. Occasional wrong-word or  sound-a-like substitutions may have occurred due to the inherent limitations of voice recognition software.     Tegeler, Canary Brim, MD 11/26/17 [phone]

## 2017-11-25 NOTE — ED Provider Notes (Signed)
11:10 PM Patient care assumed from Dr. Rush Landmarkegeler at shift change.  Per nurse, patient became very visibly anxious and agitated.  Stated that she "couldn't do this" and requested her IV be removed. Was seen ambulating out of the department in stable condition.   Vitals:   11/25/17 1830 11/25/17 2202 11/25/17 2217 11/25/17 2245  BP:  (!) 144/90 128/82 130/88  Pulse:  70 66 (!) 51  Resp:      Temp:      TempSrc:      SpO2:  98% 100% 100%  Weight: 74.8 kg     Height: 5\' 4"  (1.626 m)         Antony MaduraHumes, Dorathy Stallone, PA-C 11/25/17 2313    Tegeler, Canary Brimhristopher J, MD 11/26/17 [phone]

## 2017-11-25 NOTE — ED Triage Notes (Signed)
Pt reports having a constant frontal headache for the last week. Pt reports nausea and feeling swimmy headed. Pt reports taking 3 Advils with some relief.

## 2017-11-25 NOTE — ED Notes (Signed)
Pt requesting to stop fluids, remove IV and stand up. Pt then requested to go to the bathroom. Pt on phone with husband during this time

## 2017-11-25 NOTE — ED Notes (Signed)
Advised PA to see patient due to change in behavior.

## 2017-11-25 NOTE — ED Notes (Signed)
Pt eloped.

## 2018-02-17 ENCOUNTER — Encounter (HOSPITAL_COMMUNITY): Payer: Self-pay | Admitting: *Deleted

## 2018-02-17 ENCOUNTER — Ambulatory Visit (HOSPITAL_COMMUNITY)
Admission: EM | Admit: 2018-02-17 | Discharge: 2018-02-17 | Disposition: A | Discharge status: Home / Self Care | Payer: Self-pay | Attending: Internal Medicine | Admitting: Internal Medicine

## 2018-02-17 DIAGNOSIS — R102 Pelvic and perineal pain: Secondary | ICD-10-CM

## 2018-02-17 DIAGNOSIS — Z3202 Encounter for pregnancy test, result negative: Secondary | ICD-10-CM

## 2018-02-17 DIAGNOSIS — Z8249 Family history of ischemic heart disease and other diseases of the circulatory system: Secondary | ICD-10-CM | POA: Insufficient documentation

## 2018-02-17 DIAGNOSIS — R109 Unspecified abdominal pain: Secondary | ICD-10-CM | POA: Insufficient documentation

## 2018-02-17 DIAGNOSIS — Z791 Long term (current) use of non-steroidal anti-inflammatories (NSAID): Secondary | ICD-10-CM | POA: Insufficient documentation

## 2018-02-17 DIAGNOSIS — Z880 Allergy status to penicillin: Secondary | ICD-10-CM | POA: Insufficient documentation

## 2018-02-17 DIAGNOSIS — H409 Unspecified glaucoma: Secondary | ICD-10-CM | POA: Insufficient documentation

## 2018-02-17 LAB — POCT URINALYSIS DIP (DEVICE)
BILIRUBIN URINE: NEGATIVE
GLUCOSE, UA: NEGATIVE mg/dL
Hgb urine dipstick: NEGATIVE
KETONES UR: NEGATIVE mg/dL
NITRITE: NEGATIVE
PH: 7 (ref 5.0–8.0)
Protein, ur: NEGATIVE mg/dL
Specific Gravity, Urine: 1.02 (ref 1.005–1.030)
Urobilinogen, UA: 0.2 mg/dL (ref 0.0–1.0)

## 2018-02-17 LAB — POCT PREGNANCY, URINE: PREG TEST UR: NEGATIVE

## 2018-02-17 NOTE — ED Triage Notes (Addendum)
Patient reports one week history of left flank pain radiating around to the front of abdomen. Patient reports lower abdominal cramping. Denies UTI symptoms but states when she is using the bathroom her left side does hurt. Patient reports pain worsens with movement. No constipation. Patient states she doesn't feel normal.

## 2018-02-17 NOTE — Discharge Instructions (Signed)
No danger signs on exam today.  Urine test today at urgent care did not suggest a kidney stone or infection was causing your left flank pain.  A culture is pending; the urgent care will contact you if additional treatment is needed.  Possible causes of flank pain/upper abdominal pain include urinary tract, GI tract, musculoskeletal causes.  Try some advil otc for discomfort.  A heating pad may also decrease pain.  Followup with a primary care provider or stomach doctor for further evaluation if not improving.

## 2018-02-18 LAB — URINE CULTURE

## 2018-02-19 NOTE — ED Provider Notes (Signed)
[facility]    CSN: [id number] Arrival date & time: 02/17/18  4098     History   Chief Complaint Chief Complaint  Patient presents with  . Abdominal Pain    HPI Jessica [facility] is a 27 y.o. female. Presents today with intermittent L flank pain for the last month, some pelvic cramping.  Notices most when she is going to the bathroom, also sometimes when she is up and around/walking.  Does not occur while sitting quietly.  Not worsening, not improving.   No urinary discomfort, no change in urinary frequency. No blood in urine.  2 episodes of brief dribbling after voiding, about a month ago.   Starting taking protein shakes about a month ago, having BMs less often, qOD instead of qday. No large/hard/painful BMs.  No unusual vaginal discharge/bleeding, LMP was 11/2, normal for her.  Denies chance of pregnancy, just had period and husband uses condoms.   Has a 28 and 25 year old, feels tired a lot.  No fever.  No achiness.  No new skin change/rash.   Little bit of nausea (in the AMs, when she drinks water), no vomiting.    HPI  Past Medical History:  Diagnosis Date  . Glaucoma   . Glaucoma (increased eye pressure)   . Normal pregnancy in multigravida in third trimester 09/20/2015  . Preterm delivery 09/20/2015    Patient Active Problem List   Diagnosis Date Noted  . Normal pregnancy in multigravida in third trimester 09/20/2015  . Preterm delivery 09/20/2015  . Normal labor 09/19/2015  . Other specified indication for care or intervention related to labor and delivery, unspecified as to episode of care 10/19/2013  . Third degree laceration of perineum during delivery, postpartum 10/19/2013  . Supervision of normal pregnancy 08/09/2013  . Uterine size date discrepancy, antepartum 07/19/2013    Past Surgical History:  Procedure Laterality Date  . EYE SURGERY    . NOSE SURGERY      OB History    Gravida  2   Para  2   Term  1   Preterm  1   AB  0   Living    2     SAB  0   TAB  0   Ectopic  0   Multiple  0   Live Births  2            Home Medications    Prior to Admission medications   Medication Sig Start Date End Date Taking? Authorizing Provider  ibuprofen (ADVIL,MOTRIN) 600 MG tablet Take 1 tablet (600 mg total) by mouth every 6 (six) hours. 09/22/15   Huel Cote, MD  predniSONE (STERAPRED UNI-PAK 21 TAB) 10 MG (21) TBPK tablet Take 1 tablet (10 mg total) by mouth daily. Take 6 tabs day 1, 5 tabs day 2, 4 tabs day 3, 3 tabs day 4, 2 tabs day 5, and 1 tab on day 6. 03/19/16   Joy, Shawn C, PA-C  Prenatal Vit-Fe Fumarate-FA (PRENATAL MULTIVITAMIN) TABS tablet Take 1 tablet by mouth daily at 12 noon.    [provider]    Family History Family History  Problem Relation Age of Onset  . Diabetes Father   . Hypertension Father   . Hyperlipidemia Father     Social History Social History   Tobacco Use  . Smoking status: Never Smoker  . Smokeless tobacco: Never Used  Substance Use Topics  . Alcohol use: No  . Drug use: No  Allergies   Penicillins   Review of Systems Review of Systems  All other systems reviewed and are negative.    Physical Exam Triage Vital Signs ED Triage Vitals  Enc Vitals Group     BP 02/17/18 0940 135/80     Pulse Rate 02/17/18 0940 78     Resp 02/17/18 0940 16     Temp 02/17/18 0940 98.5 F (36.9 C)     Temp Source 02/17/18 0940 Oral     SpO2 02/17/18 0940 100 %     Weight --      Height --      Head Circumference --      Peak Flow --      Pain Score 02/17/18 0944 5     Pain Loc --      Pain Edu? --      Excl. in GC? --    No data found.  Updated Vital Signs BP 135/80 (BP Location: Right Arm)   Pulse 78   Temp 98.5 F (36.9 C) (Oral)   Resp 16   LMP 02/04/2018   SpO2 100%   Visual Acuity Right Eye Distance:   Left Eye Distance:   Bilateral Distance:    Right Eye Near:   Left Eye Near:    Bilateral Near:     Physical Exam   Constitutional: She is oriented to person, place, and time. No distress.  HENT:  Head: Atraumatic.  Eyes:  Conjugate gaze observed, no eye redness/discharge  Neck: Neck supple.  Cardiovascular: Normal rate and regular rhythm.  Pulmonary/Chest: No respiratory distress. She has no wheezes. She has no rales.  Lungs clear, symmetric breath sounds   Abdominal: Soft. She exhibits no distension. There is no tenderness. There is no rebound and no guarding.  Musculoskeletal: Normal range of motion.  Neurological: She is alert and oriented to person, place, and time.  Skin: Skin is warm and dry.  Nursing note and vitals reviewed.    UC Treatments / Results  Labs Results for orders placed or performed during the hospital encounter of 02/17/18  Urine culture  Result Value Ref Range   Specimen Description URINE, RANDOM    Special Requests      NONE Performed at [facility], [address] [place], [place] [address]    Culture      Multiple bacterial morphotypes present, none predominant. Suggest appropriate recollection if clinically indicated.   Report Status 02/18/2018 FINAL   POCT urinalysis dip (device)  Result Value Ref Range   Glucose, UA NEGATIVE NEGATIVE mg/dL   Bilirubin Urine NEGATIVE NEGATIVE   Ketones, ur NEGATIVE NEGATIVE mg/dL   Specific Gravity, Urine 1.020 1.005 - 1.030   Hgb urine dipstick NEGATIVE NEGATIVE   pH 7.0 5.0 - 8.0   Protein, ur NEGATIVE NEGATIVE mg/dL   Urobilinogen, UA 0.2 0.0 - 1.0 mg/dL   Nitrite NEGATIVE NEGATIVE   Leukocytes, UA TRACE (A) NEGATIVE  Pregnancy, urine POC  Result Value Ref Range   Preg Test, Ur NEGATIVE NEGATIVE     Final Clinical Impressions(s) / UC Diagnoses   Final diagnoses:  Left flank pain     Discharge Instructions     No danger signs on exam today.  Urine test today at urgent care did not suggest a kidney stone or infection was causing your left flank pain.  A culture is pending; the urgent care will  contact you if additional treatment is needed.  Possible causes of flank pain/upper abdominal  pain include urinary tract, GI tract, musculoskeletal causes.  Try some advil otc for discomfort.  A heating pad may also decrease pain.  Followup with a primary care provider or stomach doctor for further evaluation if not improving.      Damas Rankin, MD 02/19/18 [phone]

## 2018-02-28 ENCOUNTER — Encounter (HOSPITAL_COMMUNITY): Payer: Self-pay | Admitting: Emergency Medicine

## 2018-02-28 ENCOUNTER — Ambulatory Visit (HOSPITAL_COMMUNITY)
Admission: EM | Admit: 2018-02-28 | Discharge: 2018-02-28 | Disposition: A | Discharge status: Home / Self Care | Payer: Self-pay | Attending: Family Medicine | Admitting: Family Medicine

## 2018-02-28 DIAGNOSIS — J069 Acute upper respiratory infection, unspecified: Secondary | ICD-10-CM | POA: Diagnosis present

## 2018-02-28 LAB — POCT RAPID STREP A: Streptococcus, Group A Screen (Direct): NEGATIVE

## 2018-02-28 NOTE — ED Triage Notes (Signed)
Pt here for sore throat and URI sx  

## 2018-02-28 NOTE — Discharge Instructions (Addendum)
°  Strep test is NEGATIVE Drink plenty of water Use a humidifier if you have one May take OTC cough and cold medicine like mucinex Expect improvement in a few days Return or see PCP if not better by 8- 10 days

## 2018-02-28 NOTE — ED Provider Notes (Signed)
[facility]    CSN: [id number] Arrival date & time: 02/28/18  4098     History   Chief Complaint Chief Complaint  Patient presents with  . Sore Throat    HPI Jessica [facility] is a 27 y.o. female.   HPI  Patient states her daughter is being treated for ".  This is her fourth day of illness.  She has runny and stuffy nose, sinus pressure, sore throat, headache, mild coughing.  She feels fatigued.  She has body aches.  She has not taken any over-the-counter medication.  No known exposure to strep.  No underlying asthma or allergies.  Past Medical History:  Diagnosis Date  . Glaucoma   . Glaucoma (increased eye pressure)   . Normal pregnancy in multigravida in third trimester 09/20/2015  . Preterm delivery 09/20/2015    Patient Active Problem List   Diagnosis Date Noted  . Viral upper respiratory tract infection 02/28/2018  . Normal pregnancy in multigravida in third trimester 09/20/2015  . Preterm delivery 09/20/2015  . Normal labor 09/19/2015  . Other specified indication for care or intervention related to labor and delivery, unspecified as to episode of care 10/19/2013  . Third degree laceration of perineum during delivery, postpartum 10/19/2013  . Supervision of normal pregnancy 08/09/2013  . Uterine size date discrepancy, antepartum 07/19/2013    Past Surgical History:  Procedure Laterality Date  . EYE SURGERY    . NOSE SURGERY      OB History    Gravida  2   Para  2   Term  1   Preterm  1   AB  0   Living  2     SAB  0   TAB  0   Ectopic  0   Multiple  0   Live Births  2            Home Medications    Prior to Admission medications   Medication Sig Start Date End Date Taking? Authorizing Provider  ibuprofen (ADVIL,MOTRIN) 600 MG tablet Take 1 tablet (600 mg total) by mouth every 6 (six) hours. 09/22/15   Huel Cote, MD  Prenatal Vit-Fe Fumarate-FA (PRENATAL MULTIVITAMIN) TABS tablet Take 1 tablet by mouth daily at 12  noon.    [provider]    Family History Family History  Problem Relation Age of Onset  . Diabetes Father   . Hypertension Father   . Hyperlipidemia Father     Social History Social History   Tobacco Use  . Smoking status: Never Smoker  . Smokeless tobacco: Never Used  Substance Use Topics  . Alcohol use: No  . Drug use: No     Allergies   Penicillins   Review of Systems Review of Systems  Constitutional: Positive for fatigue. Negative for chills and fever.  HENT: Positive for congestion, postnasal drip, rhinorrhea, sinus pain and sore throat. Negative for ear pain.   Eyes: Negative for pain and visual disturbance.  Respiratory: Positive for cough. Negative for shortness of breath.   Cardiovascular: Negative for chest pain and palpitations.  Gastrointestinal: Negative for abdominal pain and vomiting.  Genitourinary: Negative for dysuria and hematuria.  Musculoskeletal: Negative for arthralgias and back pain.  Skin: Negative for color change and rash.  Neurological: Positive for headaches. Negative for seizures and syncope.  Psychiatric/Behavioral: Negative for sleep disturbance. The patient is not nervous/anxious.   All other systems reviewed and are negative.    Physical Exam Triage Vital Signs  ED Triage Vitals [02/28/18 1054]  Enc Vitals Group     BP 123/86     Pulse Rate (!) 111     Resp 18     Temp 98.9 F (37.2 C)     Temp Source Oral     SpO2 98 %     Weight      Height      Head Circumference      Peak Flow      Pain Score 6     Pain Loc      Pain Edu?      Excl. in GC?    No data found.  Updated Vital Signs BP 123/86 (BP Location: Right Arm)   Pulse (!) 111   Temp 98.9 F (37.2 C) (Oral)   Resp 18   LMP 02/04/2018   SpO2 98%       Physical Exam  Constitutional: She appears well-developed and well-nourished. She appears ill. No distress.  HENT:  Head: Normocephalic and atraumatic.  Right Ear: Hearing, tympanic membrane  and ear canal normal.  Left Ear: Hearing, tympanic membrane and ear canal normal.  Mouth/Throat: Mucous membranes are normal. Posterior oropharyngeal edema and posterior oropharyngeal erythema present. No oropharyngeal exudate. No tonsillar exudate.  Eyes: Pupils are equal, round, and reactive to light. Conjunctivae are normal.  Neck: Normal range of motion.  Cardiovascular: Normal rate, regular rhythm and normal heart sounds.  Pulmonary/Chest: Effort normal. No respiratory distress. She has wheezes.  Abdominal: Soft. She exhibits no distension.  Musculoskeletal: Normal range of motion. She exhibits no edema.  Lymphadenopathy:    She has no cervical adenopathy.  Neurological: She is alert.  Skin: Skin is warm and dry.  Psychiatric: She has a normal mood and affect. Her behavior is normal.     UC Treatments / Results  Labs (all labs ordered are listed, but only abnormal results are displayed) Labs Reviewed  POCT RAPID STREP A    EKG None  Radiology No results found.  Procedures Procedures (including critical care time)  Medications Ordered in UC Medications - No data to display  Initial Impression / Assessment and Plan / UC Course  I have reviewed the triage vital signs and the nursing notes.  Pertinent labs & imaging results that were available during my care of the patient were reviewed by me and considered in my medical decision making (see chart for details).    Strep test is negative.  This is a viral upper respiratory infection.  Discussed symptomatic care.  Expect improvement by day 8-10.  Return for worsening symptoms, fever Final Clinical Impressions(s) / UC Diagnoses   Final diagnoses:  Viral upper respiratory tract infection     Discharge Instructions      Strep test is NEGATIVE Drink plenty of water Use a humidifier if you have one May take OTC cough and cold medicine like mucinex Expect improvement in a few days Return or see PCP if not better by  8- 10 days   ED Prescriptions    None     Controlled Substance Prescriptions [place] Controlled Substance Registry consulted? Not Applicable   Eustace MooreNelson, Annalynn Centanni Sue, MD 02/28/18 1140

## 2019-01-08 DIAGNOSIS — H409 Unspecified glaucoma: Secondary | ICD-10-CM | POA: Diagnosis not present

## 2019-01-08 DIAGNOSIS — T8529XA Other mechanical complication of intraocular lens, initial encounter: Secondary | ICD-10-CM | POA: Diagnosis not present

## 2019-01-08 DIAGNOSIS — H1820 Unspecified corneal edema: Secondary | ICD-10-CM | POA: Diagnosis not present

## 2019-01-08 DIAGNOSIS — Z9889 Other specified postprocedural states: Secondary | ICD-10-CM | POA: Diagnosis not present

## 2019-01-08 DIAGNOSIS — Z961 Presence of intraocular lens: Secondary | ICD-10-CM | POA: Diagnosis not present

## 2019-01-08 DIAGNOSIS — T8522XA Displacement of intraocular lens, initial encounter: Secondary | ICD-10-CM | POA: Diagnosis not present

## 2019-03-28 ENCOUNTER — Ambulatory Visit: Payer: Medicaid Other | Attending: Internal Medicine

## 2019-03-28 DIAGNOSIS — Z20828 Contact with and (suspected) exposure to other viral communicable diseases: Secondary | ICD-10-CM | POA: Diagnosis not present

## 2019-03-28 DIAGNOSIS — Z20822 Contact with and (suspected) exposure to covid-19: Secondary | ICD-10-CM

## 2019-03-30 LAB — NOVEL CORONAVIRUS, NAA: SARS-CoV-2, NAA: NOT DETECTED

## 2019-11-12 DIAGNOSIS — Z20822 Contact with and (suspected) exposure to covid-19: Secondary | ICD-10-CM | POA: Diagnosis not present

## 2019-11-13 ENCOUNTER — Other Ambulatory Visit: Payer: Medicaid Other

## 2020-01-24 DIAGNOSIS — Z3201 Encounter for pregnancy test, result positive: Secondary | ICD-10-CM | POA: Diagnosis not present

## 2020-02-05 DIAGNOSIS — Z1151 Encounter for screening for human papillomavirus (HPV): Secondary | ICD-10-CM | POA: Diagnosis not present

## 2020-02-05 DIAGNOSIS — Z124 Encounter for screening for malignant neoplasm of cervix: Secondary | ICD-10-CM | POA: Diagnosis not present

## 2020-03-05 DIAGNOSIS — Z419 Encounter for procedure for purposes other than remedying health state, unspecified: Secondary | ICD-10-CM | POA: Diagnosis not present

## 2020-03-10 DIAGNOSIS — O219 Vomiting of pregnancy, unspecified: Secondary | ICD-10-CM | POA: Diagnosis not present

## 2020-03-10 DIAGNOSIS — Z3201 Encounter for pregnancy test, result positive: Secondary | ICD-10-CM | POA: Diagnosis not present

## 2020-03-10 DIAGNOSIS — N911 Secondary amenorrhea: Secondary | ICD-10-CM | POA: Diagnosis not present

## 2020-03-17 DIAGNOSIS — Z961 Presence of intraocular lens: Secondary | ICD-10-CM | POA: Diagnosis not present

## 2020-03-17 DIAGNOSIS — H18232 Secondary corneal edema, left eye: Secondary | ICD-10-CM | POA: Diagnosis not present

## 2020-03-17 DIAGNOSIS — H18512 Endothelial corneal dystrophy, left eye: Secondary | ICD-10-CM | POA: Diagnosis not present

## 2020-03-25 ENCOUNTER — Encounter (HOSPITAL_COMMUNITY): Payer: Self-pay | Admitting: Obstetrics and Gynecology

## 2020-03-25 ENCOUNTER — Other Ambulatory Visit: Payer: Self-pay

## 2020-03-25 ENCOUNTER — Inpatient Hospital Stay (HOSPITAL_COMMUNITY)
Admission: AD | Admit: 2020-03-25 | Discharge: 2020-03-26 | Disposition: A | Discharge status: Home / Self Care | Payer: Medicaid Other | Attending: Obstetrics and Gynecology | Admitting: Obstetrics and Gynecology

## 2020-03-25 DIAGNOSIS — O210 Mild hyperemesis gravidarum: Secondary | ICD-10-CM | POA: Diagnosis not present

## 2020-03-25 DIAGNOSIS — O219 Vomiting of pregnancy, unspecified: Secondary | ICD-10-CM | POA: Diagnosis not present

## 2020-03-25 DIAGNOSIS — Z3A08 8 weeks gestation of pregnancy: Secondary | ICD-10-CM | POA: Diagnosis not present

## 2020-03-25 LAB — URINALYSIS, ROUTINE W REFLEX MICROSCOPIC
Bilirubin Urine: NEGATIVE
Glucose, UA: NEGATIVE mg/dL
Hgb urine dipstick: NEGATIVE
Ketones, ur: 80 mg/dL — AB
Leukocytes,Ua: NEGATIVE
Nitrite: NEGATIVE
Protein, ur: 30 mg/dL — AB
Specific Gravity, Urine: 1.026 (ref 1.005–1.030)
pH: 6 (ref 5.0–8.0)

## 2020-03-25 LAB — POCT PREGNANCY, URINE: Preg Test, Ur: POSITIVE — AB

## 2020-03-25 MED ORDER — PROMETHAZINE HCL 25 MG/ML IJ SOLN
12.5000 mg | Freq: Once | INTRAMUSCULAR | Status: AC
  Administered 2020-03-25: 12.5 mg via INTRAVENOUS
  Filled 2020-03-25: qty 1

## 2020-03-25 MED ORDER — LACTATED RINGERS IV BOLUS
1000.0000 mL | Freq: Once | INTRAVENOUS | Status: AC
  Administered 2020-03-25: 1000 mL via INTRAVENOUS

## 2020-03-25 MED ORDER — FAMOTIDINE IN NACL 20-0.9 MG/50ML-% IV SOLN
20.0000 mg | Freq: Once | INTRAVENOUS | Status: AC
  Administered 2020-03-25: 20 mg via INTRAVENOUS
  Filled 2020-03-25: qty 50

## 2020-03-25 NOTE — MAU Note (Signed)
PT SAYS LMP WAS 10-24- HAD CONFIRMED AT DR Mindi Slicker OFFICE [facility] APPOINTMENT IS 12-28 - FOR U/S. .  HAS BEEN VOMITING  ALL DAY - ATE AT 6PM- PITA BREAD.  HAS BEEN DICLEGIS- NO VOMITING - JUST SLEEPY BUT DID NOT TAKE IT LAST NIGHT .

## 2020-03-25 NOTE — MAU Provider Note (Signed)
History     CSN: [id number]  Arrival date and time: 03/25/20 2134   Event Date/Time   First Provider Initiated Contact with Patient 03/25/20 2340      Chief Complaint  Patient presents with  . Emesis   Jessica Barton is a 29 y.o. [id number] at [redacted]w[redacted]d who receives care at [facility].  She presents today for Emesis.  Patient states she stopped taking her Diclegis to see if she still needed it.  Patient reports she has since thrown up about 8x today. Patient reports she tried to take one of the diclegis at 7pm and did not throw up, but still remained nausea.  Patient reports she ate at a "few bites of pita bread and some crackers" today."     OB History    Gravida  3   Para  2   Term  1   Preterm  1   AB  0   Living  2     SAB  0   IAB  0   Ectopic  0   Multiple  0   Live Births  2           Past Medical History:  Diagnosis Date  . Glaucoma   . Glaucoma (increased eye pressure)   . Normal pregnancy in multigravida in third trimester 09/20/2015  . Preterm delivery 09/20/2015    Past Surgical History:  Procedure Laterality Date  . EYE SURGERY    . NOSE SURGERY      Family History  Problem Relation Age of Onset  . Diabetes Father   . Hypertension Father   . Hyperlipidemia Father     Social History   Tobacco Use  . Smoking status: Never Smoker  . Smokeless tobacco: Never Used  Vaping Use  . Vaping Use: Never used  Substance Use Topics  . Alcohol use: No  . Drug use: No    Allergies:  Allergies  Allergen Reactions  . Penicillins Rash    Has patient had a PCN reaction causing immediate rash, facial/tongue/throat swelling, SOB or lightheadedness with hypotension: no Has patient had a PCN reaction causing severe rash involving mucus membranes or skin necrosis: no Has patient had a PCN reaction that required hospitalization no Has patient had a PCN reaction occurring within the last 10 years: No If all of the above answers are "NO", then may  proceed with Cephalosporin use.     Medications Prior to Admission  Medication Sig Dispense Refill Last Dose  . Doxylamine-Pyridoxine (DICLEGIS PO) Take by mouth.   03/24/2020 at Unknown time  . Prenatal Vit-Fe Fumarate-FA (PRENATAL MULTIVITAMIN) TABS tablet Take 1 tablet by mouth daily at 12 noon.   03/24/2020 at Unknown time  . ibuprofen (ADVIL,MOTRIN) 600 MG tablet Take 1 tablet (600 mg total) by mouth every 6 (six) hours. 30 tablet 0     Review of Systems  Constitutional: Negative for chills and fever.  Respiratory: Negative for cough and shortness of breath.   Gastrointestinal: Positive for nausea and vomiting. Negative for abdominal pain.  Genitourinary: Negative for difficulty urinating, dysuria, vaginal bleeding and vaginal discharge.  Neurological: Positive for light-headedness. Negative for dizziness and headaches.   Physical Exam   Blood pressure 117/76, pulse 97, temperature 98.2 F (36.8 C), temperature source Oral, resp. rate 20, height 5\' 4"  (1.626 m), weight 75.1 kg, last menstrual period 01/27/2020, unknown if currently breastfeeding.  Physical Exam Vitals and nursing note reviewed.  Constitutional:  General: She is in acute distress.     Appearance: Normal appearance. She is not toxic-appearing.  HENT:     Head: Normocephalic and atraumatic.  Eyes:     Conjunctiva/sclera: Conjunctivae normal.  Cardiovascular:     Rate and Rhythm: Normal rate and regular rhythm.     Heart sounds: Normal heart sounds.  Pulmonary:     Effort: Pulmonary effort is normal. No respiratory distress.     Breath sounds: Normal breath sounds.  Abdominal:     General: Bowel sounds are normal.     Palpations: Abdomen is soft.     Tenderness: There is no abdominal tenderness.  Musculoskeletal:        General: Normal range of motion.     Cervical back: Normal range of motion.  Skin:    General: Skin is warm and dry.  Neurological:     Mental Status: She is alert and oriented to  person, place, and time.  Psychiatric:        Mood and Affect: Mood normal.        Behavior: Behavior normal.        Thought Content: Thought content normal.     MAU Course  Procedures Results for orders placed or performed during the hospital encounter of 03/25/20 (from the past 24 hour(s))  Urinalysis, Routine w reflex microscopic Urine, Clean Catch     Status: Abnormal   Collection Time: 03/25/20 11:04 PM  Result Value Ref Range   Color, Urine YELLOW YELLOW   APPearance HAZY (A) CLEAR   Specific Gravity, Urine 1.026 1.005 - 1.030   pH 6.0 5.0 - 8.0   Glucose, UA NEGATIVE NEGATIVE mg/dL   Hgb urine dipstick NEGATIVE NEGATIVE   Bilirubin Urine NEGATIVE NEGATIVE   Ketones, ur 80 (A) NEGATIVE mg/dL   Protein, ur 30 (A) NEGATIVE mg/dL   Nitrite NEGATIVE NEGATIVE   Leukocytes,Ua NEGATIVE NEGATIVE   RBC / HPF 0-5 0 - 5 RBC/hpf   WBC, UA 0-5 0 - 5 WBC/hpf   Bacteria, UA RARE (A) NONE SEEN   Squamous Epithelial / LPF 0-5 0 - 5   Mucus PRESENT   Pregnancy, urine POC     Status: Abnormal   Collection Time: 03/25/20 11:05 PM  Result Value Ref Range   Preg Test, Ur POSITIVE (A) NEGATIVE    MDM Start IV LR Bolus Antiemetic PPI Assessment and Plan  29 year old [id number] at 8.2weeks N/V  -Reviewed POC with patient. -Start IV and give LR -Phenergan IV  -Will also give pepcid for stomach pain. -Informed that [place] would not be performed today b/c no c/o bleeding or abdominal pain. -Will provide interventions as above and then reassess.    Cherre Robins 03/26/2020, 1:18 AM   Reassessment (1:18 AM)  -Patient reports feeling better s/p fluids and antiemetics. -Discussed discharge to home and to take diclegis. -Encouraged rest when at home and compliance with diclegis. -Discussed trying to wean self off diclegis after [redacted] weeks GA. -Patient and husband without questions or concerns. -Encouraged to call or return to MAU if symptoms worsen or with the onset of new  symptoms. -Discharged to home in improved condition.   Cherre Robins MSN, CNM Advanced Practice Provider, [facility] [employer]

## 2020-03-26 NOTE — Discharge Instructions (Signed)

## 2020-04-01 DIAGNOSIS — Z113 Encounter for screening for infections with a predominantly sexual mode of transmission: Secondary | ICD-10-CM | POA: Diagnosis not present

## 2020-04-01 DIAGNOSIS — O26891 Other specified pregnancy related conditions, first trimester: Secondary | ICD-10-CM | POA: Diagnosis not present

## 2020-04-01 DIAGNOSIS — Z3481 Encounter for supervision of other normal pregnancy, first trimester: Secondary | ICD-10-CM | POA: Diagnosis not present

## 2020-04-01 DIAGNOSIS — Z3A09 9 weeks gestation of pregnancy: Secondary | ICD-10-CM | POA: Diagnosis not present

## 2020-04-01 DIAGNOSIS — Z369 Encounter for antenatal screening, unspecified: Secondary | ICD-10-CM | POA: Diagnosis not present

## 2020-04-01 DIAGNOSIS — O09211 Supervision of pregnancy with history of pre-term labor, first trimester: Secondary | ICD-10-CM | POA: Diagnosis not present

## 2020-04-01 DIAGNOSIS — Z368A Encounter for antenatal screening for other genetic defects: Secondary | ICD-10-CM | POA: Diagnosis not present

## 2020-04-01 LAB — OB RESULTS CONSOLE ABO/RH: RH Type: POSITIVE

## 2020-04-01 LAB — OB RESULTS CONSOLE HIV ANTIBODY (ROUTINE TESTING): HIV: NONREACTIVE

## 2020-04-01 LAB — OB RESULTS CONSOLE ANTIBODY SCREEN: Antibody Screen: NEGATIVE

## 2020-04-01 LAB — OB RESULTS CONSOLE HEPATITIS B SURFACE ANTIGEN: Hepatitis B Surface Ag: NEGATIVE

## 2020-04-01 LAB — OB RESULTS CONSOLE GC/CHLAMYDIA
Chlamydia: NEGATIVE
Gonorrhea: NEGATIVE

## 2020-04-01 LAB — OB RESULTS CONSOLE RUBELLA ANTIBODY, IGM: Rubella: IMMUNE

## 2020-04-01 LAB — OB RESULTS CONSOLE RPR: RPR: NONREACTIVE

## 2020-04-05 DIAGNOSIS — Z419 Encounter for procedure for purposes other than remedying health state, unspecified: Secondary | ICD-10-CM | POA: Diagnosis not present

## 2020-04-05 NOTE — L&D Delivery Note (Signed)
Delivery Note She progressed to complete and pushed well.  At 8:10 AM a viable female was delivered via Vaginal, Spontaneous (Presentation: Left Occiput Anterior).  APGAR: 8, 9; weight pending.   Placenta status: Spontaneous, Intact.  Cord: 3 vessels with the following complications: Nuchal x 1 reduced.   Anesthesia: Epidural Episiotomy: None Lacerations: 2nd degree;Perineal Suture Repair: 3.0 chromic Est. Blood Loss (mL):  300  Mom to postpartum.  Baby to Couplet care / Skin to Skin. They would like him circumcised  Zenaida Niece 10/20/2020, 8:36 AM

## 2020-04-08 DIAGNOSIS — H4052X2 Glaucoma secondary to other eye disorders, left eye, moderate stage: Secondary | ICD-10-CM | POA: Diagnosis not present

## 2020-04-17 ENCOUNTER — Other Ambulatory Visit: Payer: Self-pay

## 2020-04-22 DIAGNOSIS — Z3A12 12 weeks gestation of pregnancy: Secondary | ICD-10-CM | POA: Diagnosis not present

## 2020-04-22 DIAGNOSIS — Z1379 Encounter for other screening for genetic and chromosomal anomalies: Secondary | ICD-10-CM | POA: Diagnosis not present

## 2020-04-24 ENCOUNTER — Ambulatory Visit: Payer: Medicaid Other | Attending: Obstetrics and Gynecology | Admitting: Genetic Counselor

## 2020-04-24 ENCOUNTER — Other Ambulatory Visit: Payer: Self-pay

## 2020-04-24 ENCOUNTER — Encounter: Payer: Self-pay | Admitting: Genetic Counselor

## 2020-04-24 ENCOUNTER — Ambulatory Visit: Payer: Self-pay | Admitting: Genetic Counselor

## 2020-04-24 DIAGNOSIS — O09891 Supervision of other high risk pregnancies, first trimester: Secondary | ICD-10-CM

## 2020-04-24 DIAGNOSIS — Z141 Cystic fibrosis carrier: Secondary | ICD-10-CM

## 2020-04-24 DIAGNOSIS — Z315 Encounter for genetic counseling: Secondary | ICD-10-CM

## 2020-04-24 DIAGNOSIS — Z843 Family history of consanguinity: Secondary | ICD-10-CM | POA: Diagnosis not present

## 2020-04-24 NOTE — Progress Notes (Signed)
04/24/2020  Jessica Barton [birth year] MRN: [id number] DOV: 04/24/2020  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 Jessica Barton was accompanied to her appointment by her husband, Alaa Hamed.   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 female. Her current pregnancy has completed [redacted]w[redacted]d (Estimated Date of Delivery: 11/02/20). Ms. Tolle and her husband have two healthy daughters together.  Jessica Barton denied exposure to environmental toxins or chemical agents. She denied the use of alcohol, tobacco or street drugs. She reported taking Diclegis, Dorzolamide eye drops, and Muro eye drops. She denied significant viral illnesses, fevers, and bleeding during the course of her pregnancy. She has a history of glaucoma. Her medical and surgical histories were otherwise noncontributory.  Family History  A three generation pedigree was drafted and reviewed. The family history is remarkable for the following:  - Jessica Barton's maternal uncle is her husband's father. This makes the couple first cousins to one another. For first cousin unions, the chance of having a child with a birth defect, intellectual disability, or genetic condition is increased approximately 2-4% above the general population risk of 3-5%. First cousins share approximately 12.5% (1/8) of their genetic material. Children of first cousin unions are homozygous at 6.25% (1/16) of their gene loci. If these loci are associated with recessive genetic conditions, then there is a chance that the children will be affected by that condition. If Jessica Barton and her husband were to carry the same recessive condition, they would have a 1 in 4 (25%) chance of having a child with that condition. See Discussion section for more details.  - Mr. Purvis Sheffield has a 28 year old paternal half brother with trisomy 18. Mr.  Purvis Sheffield believed that his step mother had this child when she was in her 44s. We reviewed that Down syndrome is most often caused by an entire extra copy of chromosome 21. This occurs due to errors in chromosomal division during the creation of egg and sperm cells in a process called nondisjunction. Given that Mr. Birdie Sons step mother was considered to be of advanced maternal age when she had her son, it is likely that Down syndrome occurred due to a random nondisjunction event. If this is the case, it is not expected that there would be an increased risk of recurrence for Down syndrome in the couple's children. [name] has had a sample drawn for aneuploidy screening during the current pregnancy. See Discussion section for more details.  The remaining family histories were reviewed and found to be noncontributory for birth defects, intellectual disability, recurrent pregnancy loss, and known genetic conditions.    The patient's ancestry is [place]. The father of the pregnancy's ancestry is [place] and [place]. Ashkenazi Jewish ancestry was denied. Pedigree will be scanned under Media.  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 We discussed that CF is a condition characterized by the buildup of thick, sticky mucus that can damage the body's organs. Mucus is a slippery substance that lubricates and protects the linings of the airways, digestive system, reproductive system, and other organs and tissues. Individuals with CF have abnormally sticky mucus that can clog the airways and digestive system, leading to progressive damage to the respiratory system and chronic  digestive system problems. The most common features of CF include respiratory difficulties, bacterial infections in the lungs, the formation of scar tissue (fibrosis) and cysts in the lungs, pancreatic  insufficiency, CF-related diabetes mellitus, diarrhea, malnutrition, poor growth, and weight loss. Most men with CF have congenital bilateral absence of the vas deferens (CBAVD) which causes female infertility. With therapies, such as daily respiratory therapies and medications to aid digestion, the median lifespan for people with CF is now in their 40's. Treatment may involve lung transplantation and CFTR protein modulators in some cases. CF is variably expressed, meaning features of the condition and their severity vary among affected individuals. Clinical manifestations of CF present in the first year of life for 66% of affected individuals but are lifelong.   We reviewed that CF is caused by mutations in the CFTR gene. This gene provides instructions for a channel that transports chloride ions into and out of cells. The flow of chloride ions helps control the movement of water in the body's tissues, which is necessary for the production of thin, freely flowing mucus. The CFTR protein also regulates the function of other channels, including those that transport sodium irons across cell membranes. This process is necessary for normal function of the lungs and pancreas. Pathogenic variants in the CFTR gene alter the production, structure, or stability of the CFTR protein. All of these changes prevent the associated channels from functioning properly, which impairs the transport of chloride ions and the movement of water into and out of cells. As a result, cells that line the passageway of the lungs, pancreas, and other organs produce mucus that obstructs airways and glands, leading to the characteristic signs and symptoms of CF.   The expression and severity of CF depends upon the specific pathogenic variants in the CFTR gene in an affected individual. More than 1000 pathogenic variants in the CFTR gene have been described. Most of these variants change single amino acids in the CFTR protein or delete a small  amount of DNA from the CFTR gene. We discussed that [name]'s variant in the CFTR gene (c.3454G>C, p.D1152H) is associated with a variant form of CF as it allows for some residual CFTR protein activity. The clinical features of affected individuals with this variant is heterogeneous and depends on the other CFTR variant that is present. Most affected individuals who are homozygous for this variant do not meet diagnostic criteria for CF. Some individuals may have a mild course with normal or borderline sweat tests, pancreatic sufficiency, and normal lung function and nutrition. Other individuals may experience recurrent pneumonia or bronchiectasis and may have pancreatic insufficiency. When affected individuals have Ms. Metheny's variant in trans with a different pathogenic CFTR variant, they may experience symptoms such as pancreatitis, CBAVD, and milder or late-onset CF.   Ms. Winbush was counseled that CF is inherited in an autosomal recessive fashion. This means that the current fetus is only at risk for CF or a CF-related disorder if Ms. Kielbasa's partner is also a carrier for the condition. Based on the pan-ethnic carrier frequency for CF, Ms. Magner partner has a 1 in 45 chance of being a carrier for CF. However, given that the couple are first cousins, Mr. Birdie Sons chance of being a carrier is likely elevated above this risk. He has a higher likelihood of carrying the same variant in the CFTR gene as Ms. Bettendorf based on their shared family history. If Ms. Kozakiewicz's husband is also identified to be a carrier, the risk for  CF in the couple's children would be 1 in 4 (25%).   Ms. Joseph Pierinisa's carrier screening was negative for the other two conditions screened. She has also had a negative hemoglobin electrophoresis for hemoglobinopathies. Thus, her risk to be a carrier for these additional conditions has been reduced but not eliminated. This also significantly reduces her risk of having a child affected by one of these conditions. We  discussed that carrier testing for CF is recommended for Ms. Keatley's partner. Partner carrier screening would allow for a more accurate prediction of which features the couple's children may be at risk for. Ms. Odette Fractionsa and her husband indicated that they are interested in pursuing partner carrier screening. They also understand that newborn screening performed for all infants in [place] assesses for CF after birth.  Aneuploidy screening:  Ms. Odette Fractionsa informed me that she had a sample drawn for noninvasive prenatal screening (NIPS) two days ago at her OBGYN office. We discussed that NIPS analyzes cell free DNA originating from the placenta that is found in the maternal blood circulation during pregnancy to assess the risk for chromosomal aneuploidies in a pregnancy. This test is not diagnostic for chromosome conditions, but can provide information regarding the presence or absence of extra fetal DNA for chromosomes 13, 18, 21, and the sex chromosomes. Thus, it would not identify or rule out all fetal aneuploidy. The reported detection rate is 91-99% for trisomies 21, 18, 13, and sex chromosome aneuploidies. The false positive rate is reported to be less than 0.1% for any of these conditions. Results from Ms. Zieger's testing will likely be back within a week of when her sample was drawn.  Diagnostic testing:  Ms. Odette Fractionsa was also counseled regarding the option of diagnostic testing via chorionic villus sampling (CVS) or amniocentesis . We discussed the technical aspects of each procedure and quoted up to a 1 in 500 (0.2%) risk for spontaneous pregnancy loss or other adverse pregnancy outcomes as a result of either procedure. Cultured cells from either a placental or amniotic fluid sample allow for the visualization of a fetal karyotype, which can detect >99% of large chromosomal aberrations. Chromosomal microarray can also be performed to identify smaller deletions or duplications of fetal chromosomal material. CVS or  amniocentesis could also be performed to assess whether the baby is affected by CF. After careful consideration, Ms. Styer declined diagnostic testing at this time. She understands that diagnostic testing is available at any point through the end of pregnancy and that she may opt to undergo the procedure at a later date should she change her mind.   Plan:  Ms. Joseph Pierinisa's husband had his blood drawn for CF carrier screening today. Testing was ordered through the laboratory [facility] as Mr. Purvis SheffieldHamed qualified for free testing through their Patient Assistance Program. Ms. Odette Fractionsa will send me a copy of their tax forms from 2020 through MyChart for income verification purposes. Results will take 2-3 weeks to be returned. I will call the couple once results become available.  I counseled Ms. Idris regarding the above risks and available options. The approximate face-to-face time with the genetic counselor was 50 minutes.  In summary:  Discussed carrier screening results and options for follow-up testing  Carrier for cystic fibrosis  Patient's mutation is associated with a milder variant form of cystic fibrosis  Husband had sample drawn for partner carrier screening today. We will follow results  Reviewed NIPS as an aneuploidy screening option  Patient had sample drawn for NIPS in her OBGYN  provider's office earlier this week  Offered additional testing and screening  Declined chorionic villus sampling  Reviewed family history concerns   Gershon Crane, MS, Aeronautical engineer

## 2020-05-05 ENCOUNTER — Other Ambulatory Visit: Payer: Self-pay

## 2020-05-06 ENCOUNTER — Telehealth: Payer: Self-pay | Admitting: Genetic Counselor

## 2020-05-06 DIAGNOSIS — Z419 Encounter for procedure for purposes other than remedying health state, unspecified: Secondary | ICD-10-CM | POA: Diagnosis not present

## 2020-05-06 NOTE — Telephone Encounter (Signed)
I called [name] upon receiving her MyChart message inquiring about her husband (Jessica Barton)'s test results. Mr. Jessica Barton had carrier screening for cystic fibrosis (CF) ordered on 04/24/20 (see Genetic Counseling note for more details). I informed [name] that her husband's carrier screening results were returned today and were negative for pathogenic variants in the CFTRgene associated with CF. This significantly decreases his chance of being a carrier for the condition. We reviewed that based on the pan-ethnic carrier frequency for CF and the sensitivity of this screen, Mr. Jessica Barton residual risk of being a carrier for a CFTR-related condition is 1 in 800. Thus, the couple's chance of having a baby with CF or a CFTR-related condition has been reduced to 1 in 3200 (0.03%). [name] was thrilled to hear this news, especially because she learned today that the fetus is expected to be a female and the couple has two daughters together. She confirmed that she had no further questions for me at this time.  Gershon Crane, MS, [facility] Genetic Counselor

## 2020-06-03 DIAGNOSIS — H18232 Secondary corneal edema, left eye: Secondary | ICD-10-CM | POA: Diagnosis not present

## 2020-06-03 DIAGNOSIS — Z419 Encounter for procedure for purposes other than remedying health state, unspecified: Secondary | ICD-10-CM | POA: Diagnosis not present

## 2020-06-03 DIAGNOSIS — H4052X2 Glaucoma secondary to other eye disorders, left eye, moderate stage: Secondary | ICD-10-CM | POA: Diagnosis not present

## 2020-06-13 DIAGNOSIS — Z3A19 19 weeks gestation of pregnancy: Secondary | ICD-10-CM | POA: Diagnosis not present

## 2020-06-13 DIAGNOSIS — Z363 Encounter for antenatal screening for malformations: Secondary | ICD-10-CM | POA: Diagnosis not present

## 2020-06-13 DIAGNOSIS — O09212 Supervision of pregnancy with history of pre-term labor, second trimester: Secondary | ICD-10-CM | POA: Diagnosis not present

## 2020-07-04 DIAGNOSIS — Z419 Encounter for procedure for purposes other than remedying health state, unspecified: Secondary | ICD-10-CM | POA: Diagnosis not present

## 2020-07-16 DIAGNOSIS — H18512 Endothelial corneal dystrophy, left eye: Secondary | ICD-10-CM | POA: Diagnosis not present

## 2020-07-16 DIAGNOSIS — H18232 Secondary corneal edema, left eye: Secondary | ICD-10-CM | POA: Diagnosis not present

## 2020-07-16 DIAGNOSIS — H4052X2 Glaucoma secondary to other eye disorders, left eye, moderate stage: Secondary | ICD-10-CM | POA: Diagnosis not present

## 2020-07-22 DIAGNOSIS — H4052X2 Glaucoma secondary to other eye disorders, left eye, moderate stage: Secondary | ICD-10-CM | POA: Diagnosis not present

## 2020-08-02 DIAGNOSIS — R0982 Postnasal drip: Secondary | ICD-10-CM | POA: Diagnosis not present

## 2020-08-02 DIAGNOSIS — J029 Acute pharyngitis, unspecified: Secondary | ICD-10-CM | POA: Diagnosis not present

## 2020-08-02 DIAGNOSIS — Z1152 Encounter for screening for COVID-19: Secondary | ICD-10-CM | POA: Diagnosis not present

## 2020-08-02 DIAGNOSIS — Z331 Pregnant state, incidental: Secondary | ICD-10-CM | POA: Diagnosis not present

## 2020-08-03 DIAGNOSIS — Z419 Encounter for procedure for purposes other than remedying health state, unspecified: Secondary | ICD-10-CM | POA: Diagnosis not present

## 2020-08-08 DIAGNOSIS — O365913 Maternal care for other known or suspected poor fetal growth, first trimester, fetus 3: Secondary | ICD-10-CM | POA: Diagnosis not present

## 2020-08-08 DIAGNOSIS — Z3689 Encounter for other specified antenatal screening: Secondary | ICD-10-CM | POA: Diagnosis not present

## 2020-08-08 DIAGNOSIS — Z23 Encounter for immunization: Secondary | ICD-10-CM | POA: Diagnosis not present

## 2020-08-08 DIAGNOSIS — Z3A27 27 weeks gestation of pregnancy: Secondary | ICD-10-CM | POA: Diagnosis not present

## 2020-08-14 DIAGNOSIS — O99810 Abnormal glucose complicating pregnancy: Secondary | ICD-10-CM | POA: Diagnosis not present

## 2020-08-16 ENCOUNTER — Encounter (HOSPITAL_COMMUNITY): Payer: Self-pay | Admitting: Obstetrics and Gynecology

## 2020-08-16 ENCOUNTER — Other Ambulatory Visit: Payer: Self-pay

## 2020-08-16 ENCOUNTER — Inpatient Hospital Stay (HOSPITAL_COMMUNITY)
Admission: AD | Admit: 2020-08-16 | Discharge: 2020-08-16 | Disposition: A | Discharge status: Home / Self Care | Payer: Medicaid Other | Attending: Obstetrics and Gynecology | Admitting: Obstetrics and Gynecology

## 2020-08-16 DIAGNOSIS — O98813 Other maternal infectious and parasitic diseases complicating pregnancy, third trimester: Secondary | ICD-10-CM

## 2020-08-16 DIAGNOSIS — O99613 Diseases of the digestive system complicating pregnancy, third trimester: Secondary | ICD-10-CM | POA: Insufficient documentation

## 2020-08-16 DIAGNOSIS — R197 Diarrhea, unspecified: Secondary | ICD-10-CM | POA: Diagnosis present

## 2020-08-16 DIAGNOSIS — Z3A28 28 weeks gestation of pregnancy: Secondary | ICD-10-CM | POA: Diagnosis not present

## 2020-08-16 DIAGNOSIS — K529 Noninfective gastroenteritis and colitis, unspecified: Secondary | ICD-10-CM | POA: Insufficient documentation

## 2020-08-16 DIAGNOSIS — A09 Infectious gastroenteritis and colitis, unspecified: Secondary | ICD-10-CM | POA: Diagnosis not present

## 2020-08-16 LAB — URINALYSIS, ROUTINE W REFLEX MICROSCOPIC
Bilirubin Urine: NEGATIVE
Glucose, UA: NEGATIVE mg/dL
Hgb urine dipstick: NEGATIVE
Ketones, ur: NEGATIVE mg/dL
Leukocytes,Ua: NEGATIVE
Nitrite: NEGATIVE
Protein, ur: NEGATIVE mg/dL
Specific Gravity, Urine: 1.013 (ref 1.005–1.030)
pH: 6 (ref 5.0–8.0)

## 2020-08-16 MED ORDER — SODIUM CHLORIDE 0.9 % IV SOLN
8.0000 mg | Freq: Once | INTRAVENOUS | Status: AC
  Administered 2020-08-16: 8 mg via INTRAVENOUS
  Filled 2020-08-16: qty 4

## 2020-08-16 MED ORDER — LACTATED RINGERS IV BOLUS
1000.0000 mL | Freq: Once | INTRAVENOUS | Status: AC
  Administered 2020-08-16: 1000 mL via INTRAVENOUS

## 2020-08-16 NOTE — Discharge Instructions (Signed)
Safe Medications in Pregnancy   Acne: Benzoyl Peroxide Salicylic Acid  Backache/Headache: Tylenol: 2 regular strength every 4 hours OR              2 Extra strength every 6 hours  Colds/Coughs/Allergies: Benadryl (alcohol free) 25 mg every 6 hours as needed Breath right strips Claritin Cepacol throat lozenges Chloraseptic throat spray Cold-Eeze- up to three times per day Cough drops, alcohol free Flonase (by prescription only) Guaifenesin Mucinex Robitussin DM (plain only, alcohol free) Saline nasal spray/drops Sudafed (pseudoephedrine) & Actifed ** use only after [redacted] weeks gestation and if you do not have high blood pressure Tylenol Vicks Vaporub Zinc lozenges Zyrtec   Constipation: Colace Ducolax suppositories Fleet enema Glycerin suppositories Metamucil Milk of magnesia Miralax Senokot Smooth move tea  Diarrhea: Kaopectate Imodium A-D  *NO pepto Bismol  Hemorrhoids: Anusol Anusol HC Preparation H Tucks  Indigestion: Tums Maalox Mylanta Zantac  Pepcid  Insomnia: Benadryl (alcohol free) 25mg  every 6 hours as needed Tylenol PM Unisom, no Gelcaps  Leg Cramps: Tums MagGel  Nausea/Vomiting:  Bonine Dramamine Emetrol Ginger extract Sea bands Meclizine  Nausea medication to take during pregnancy:  Unisom (doxylamine succinate 25 mg tablets) Take one tablet daily at bedtime. If symptoms are not adequately controlled, the dose can be increased to a maximum recommended dose of two tablets daily (1/2 tablet in the morning, 1/2 tablet mid-afternoon and one at bedtime). Vitamin B6 100mg  tablets. Take one tablet twice a day (up to 200 mg per day).  Skin Rashes: Aveeno products Benadryl cream or 25mg  every 6 hours as needed Calamine Lotion 1% cortisone cream  Yeast infection: Gyne-lotrimin 7 Monistat 7  Gum/tooth pain: Anbesol  **If taking multiple medications, please check labels to avoid duplicating the same active ingredients **take  medication as directed on the label ** Do not exceed 4000 mg of tylenol in 24 hours **Do not take medications that contain aspirin or ibuprofen     Viral Gastroenteritis, Adult  Viral gastroenteritis is also known as the stomach flu. This condition may affect your stomach, your small intestine, and your large intestine. It can cause sudden watery poop (diarrhea), fever, and throwing up (vomiting). This condition is caused by certain germs (viruses). These germs can be passed from person to person very easily (are contagious). Having watery poop and throwing up can make you feel weak and cause you to not have enough water in your body (get dehydrated). This can make you tired and thirsty, make you have a dry mouth, and make it so you pee (urinate) less often. It is important to replace the fluids that you lose from having watery poop and throwing up. What are the causes?  You can get sick by catching viruses from other people.  You can also get sick by: ? Eating food, drinking water, or touching a surface that has the viruses on it (is contaminated). ? Sharing utensils or other personal items with a person who is sick. What increases the risk?  Having a weak body defense system (immune system).  Living with one or more children who are younger than 81 years old.  Living in a nursing home.  Going on cruise ships. What are the signs or symptoms? Symptoms of this condition start suddenly. Symptoms may last for a few days or for as long as a week.  Common symptoms include: ? Watery poop. ? Throwing up.  Other symptoms include: ? Fever. ? Headache. ? Feeling tired (fatigue). ? Pain in the belly (  abdomen). ? Chills. ? Feeling weak. ? Feeling sick to your stomach (nauseous). ? Muscle aches. ? Not feeling hungry. How is this treated?  This condition typically goes away on its own.  The focus of treatment is to replace the fluids that you lose. This condition may be treated  with: ? An ORS (oral rehydration solution). This is a drink that is sold at pharmacies and stores. ? Medicines to help with your symptoms. ? Probiotic supplements to reduce symptoms of diarrhea. ? Fluids given through an IV tube, if needed.  Older adults and people with other diseases or a weak body defense system are at higher risk for not having enough water in the body. Follow these instructions at home: Eating and drinking  Take an ORS as told by your doctor.  Drink clear fluids in small amounts as you are able. Clear fluids include: ? Water. ? Ice chips. ? Fruit juice with water added to it (diluted). ? Low-calorie sports drinks.  Drink enough fluid to keep your pee (urine) pale yellow.  Eat small amounts of healthy foods every 3-4 hours as you are able. This may include whole grains, fruits, vegetables, lean meats, and yogurt.  Avoid fluids that have a lot of sugar or caffeine in them, such as energy drinks, sports drinks, and soda.  Avoid spicy or fatty foods.  Avoid alcohol.   General instructions  Wash your hands often. This is very important after you have watery poop or you throw up. If you cannot use soap and water, use hand sanitizer.  Make sure that all people in your home wash their hands well and often.  Take over-the-counter and prescription medicines only as told by your doctor.  Rest at home while you get better.  Watch your condition for any changes.  Take a warm bath to help with any burning or pain from having watery poop.  Keep all follow-up visits as told by your doctor. This is important.   Contact a doctor if:  You cannot keep fluids down.  Your symptoms get worse.  You have new symptoms.  You feel light-headed.  You feel dizzy.  You have muscle cramps. Get help right away if:  You have chest pain.  You feel very weak.  You pass out (faint).  You see blood in your throw-up.  Your throw-up looks like coffee grounds.  You have  bloody or black poop (stools) or poop that looks like tar.  You have a very bad headache, or a stiff neck, or both.  You have a rash.  You have very bad pain, cramping, or bloating in your belly.  You have trouble breathing.  You are breathing very quickly.  You have a fast heartbeat.  Your skin feels cold and clammy.  You feel mixed up (confused).  You have pain when you pee.  You have signs of not having enough water in the body, such as: ? Dark pee, hardly any pee, or no pee. ? Cracked lips. ? Dry mouth. ? Sunken eyes. ? Feeling very sleepy. ? Feeling weak. Summary  Viral gastroenteritis is also known as the stomach flu.  This condition can cause sudden watery poop (diarrhea), fever, and throwing up (vomiting).  These germs can be passed from person to person very easily.  Take an ORS as told by your doctor. This is a drink that is sold at pharmacies and stores.  Drink fluids in small amounts many times each day as you are able. This  information is not intended to replace advice given to you by your health care provider. Make sure you discuss any questions you have with your health care provider. Document Revised: 01/25/2018 Document Reviewed: 01/25/2018 Elsevier Patient Education  2021 [employer].

## 2020-08-16 NOTE — MAU Note (Signed)
Pt up to urinate-states she is feeling better following liter of fluid and zofran. States she need to get home to her sick daughter. Denies any more stools or vomiting

## 2020-08-16 NOTE — MAU Provider Note (Signed)
History     CSN: [id number]  Arrival date and time: 08/16/20 1456   None     Chief Complaint  Patient presents with  . Diarrhea  . Fatigue   HPI  Jessica Barton is a 30 y.o. [id number] at [redacted]w[redacted]d who presents with diarrhea. Symptoms started this morning. Has had 11 watery stools today. Denies fever/chills, n/v, abdominal pain, vaginal bleeding, or LOF. Reports good fetal movement. States her daughter is at home with the same symptoms. Has not taken anything for her symptoms. No blood in stool or rectal pain. No change in medications or recent hospitalization.   OB History    Gravida  3   Para  2   Term  1   Preterm  1   AB  0   Living  2     SAB  0   IAB  0   Ectopic  0   Multiple  0   Live Births  2           Past Medical History:  Diagnosis Date  . Glaucoma (increased eye pressure)   . Normal pregnancy in multigravida in third trimester 09/20/2015  . Preterm delivery 09/20/2015    Past Surgical History:  Procedure Laterality Date  . EYE SURGERY    . NOSE SURGERY      Family History  Problem Relation Age of Onset  . Diabetes Father   . Hypertension Father   . Hyperlipidemia Father     Social History   Tobacco Use  . Smoking status: Never Smoker  . Smokeless tobacco: Never Used  Vaping Use  . Vaping Use: Never used  Substance Use Topics  . Alcohol use: No  . Drug use: No    Allergies:  Allergies  Allergen Reactions  . Penicillins Rash    Has patient had a PCN reaction causing immediate rash, facial/tongue/throat swelling, SOB or lightheadedness with hypotension: no Has patient had a PCN reaction causing severe rash involving mucus membranes or skin necrosis: no Has patient had a PCN reaction that required hospitalization no Has patient had a PCN reaction occurring within the last 10 years: No If all of the above answers are "NO", then may proceed with Cephalosporin use.     Medications Prior to Admission  Medication Sig Dispense  Refill Last Dose  . Doxylamine-Pyridoxine (DICLEGIS PO) Take by mouth.   08/15/2020 at Unknown time  . Prenatal Vit-Fe Fumarate-FA (PRENATAL MULTIVITAMIN) TABS tablet Take 1 tablet by mouth daily at 12 noon.   08/15/2020 at Unknown time  . apraclonidine (IOPIDINE) 0.5 % ophthalmic solution Place 1 drop into the left eye 3 (three) times daily.     Marland Kitchen azithromycin (ZITHROMAX) 250 MG tablet azithromycin 250 mg tablet  TAKE 2 TABLETS BY MOUTH TOGETHER TODAY, THEN 1 TABLET DAILY FOR FOUR DAYS WITH FOOD.     . Dorzolamide HCl-Timolol Mal PF 2-0.5 % SOLN Place 1 drop into the left eye 2 (two) times daily.       Review of Systems  Constitutional: Positive for fatigue. Negative for chills and fever.  Gastrointestinal: Positive for diarrhea. Negative for abdominal pain, anal bleeding, blood in stool, nausea, rectal pain and vomiting.  Genitourinary: Negative.    Physical Exam   Blood pressure (!) 96/43, pulse (!) 102, temperature 98.2 F (36.8 C), temperature source Oral, resp. rate 20, last menstrual period 01/27/2020, SpO2 99 %, unknown if currently breastfeeding.  Physical Exam Vitals and nursing note reviewed.  Constitutional:  General: She is not in acute distress.    Appearance: Normal appearance. She is normal weight. She is not ill-appearing or diaphoretic.  HENT:     Head: Normocephalic and atraumatic.  Cardiovascular:     Rate and Rhythm: Tachycardia present.     Heart sounds: Normal heart sounds.  Pulmonary:     Effort: Pulmonary effort is normal. No respiratory distress.  Neurological:     Mental Status: She is alert.  Psychiatric:        Mood and Affect: Mood normal.        Behavior: Behavior normal.    NST:  Baseline: 145 bpm, Variability: Good {> 6 bpm), Accelerations: Reactive and Decelerations: Absent  MAU Course  Procedures No results found for this or any previous visit (from the past 24 hour(s)).  MDM Patient with new onset diarrhea & feels dehydrated. Daughter  with similar symptoms - suspect viral GI illness. Given IV fluid bolus & zofran in MAU - reports improvement in symptoms & requesting to be discharged home. Unable to give urine sample while in MAU. Reactive fetal tracing with no contractions.   Assessment and Plan   1. Gastroenteritis presumed infectious   2. [redacted] weeks gestation of pregnancy    -bland diet -increase hydration -OTC meds for symptoms -reviewed reasons to return to MAU  Judeth Horn 08/16/2020, 6:05 PM

## 2020-08-16 NOTE — MAU Note (Signed)
Pt reports to mau with c/o 11 episodes of diarrhea today.  Pt reports she feels very week.  Denies ctx or LOF.

## 2020-08-16 NOTE — MAU Note (Signed)
No episodes of vomiting/ diarrhea since arrival-pt remains unable to urinate-LR infusing at bolus rate

## 2020-08-26 DIAGNOSIS — H4052X2 Glaucoma secondary to other eye disorders, left eye, moderate stage: Secondary | ICD-10-CM | POA: Diagnosis not present

## 2020-09-03 DIAGNOSIS — Z419 Encounter for procedure for purposes other than remedying health state, unspecified: Secondary | ICD-10-CM | POA: Diagnosis not present

## 2020-09-05 DIAGNOSIS — H02055 Trichiasis without entropian left lower eyelid: Secondary | ICD-10-CM | POA: Diagnosis not present

## 2020-09-05 DIAGNOSIS — Z961 Presence of intraocular lens: Secondary | ICD-10-CM | POA: Diagnosis not present

## 2020-09-05 DIAGNOSIS — H4052X2 Glaucoma secondary to other eye disorders, left eye, moderate stage: Secondary | ICD-10-CM | POA: Diagnosis not present

## 2020-09-05 DIAGNOSIS — H18232 Secondary corneal edema, left eye: Secondary | ICD-10-CM | POA: Diagnosis not present

## 2020-09-08 DIAGNOSIS — O99343 Other mental disorders complicating pregnancy, third trimester: Secondary | ICD-10-CM | POA: Diagnosis not present

## 2020-09-08 DIAGNOSIS — H409 Unspecified glaucoma: Secondary | ICD-10-CM | POA: Diagnosis not present

## 2020-09-08 DIAGNOSIS — Z3A32 32 weeks gestation of pregnancy: Secondary | ICD-10-CM | POA: Diagnosis not present

## 2020-09-08 DIAGNOSIS — O09213 Supervision of pregnancy with history of pre-term labor, third trimester: Secondary | ICD-10-CM | POA: Diagnosis not present

## 2020-09-08 DIAGNOSIS — O99350 Diseases of the nervous system complicating pregnancy, unspecified trimester: Secondary | ICD-10-CM | POA: Diagnosis not present

## 2020-09-16 DIAGNOSIS — H40812 Glaucoma with increased episcleral venous pressure, left eye: Secondary | ICD-10-CM | POA: Diagnosis not present

## 2020-09-16 DIAGNOSIS — Z3A33 33 weeks gestation of pregnancy: Secondary | ICD-10-CM | POA: Diagnosis not present

## 2020-09-23 DIAGNOSIS — H4052X2 Glaucoma secondary to other eye disorders, left eye, moderate stage: Secondary | ICD-10-CM | POA: Diagnosis not present

## 2020-09-24 DIAGNOSIS — O09293 Supervision of pregnancy with other poor reproductive or obstetric history, third trimester: Secondary | ICD-10-CM | POA: Diagnosis not present

## 2020-09-24 DIAGNOSIS — Z3A34 34 weeks gestation of pregnancy: Secondary | ICD-10-CM | POA: Diagnosis not present

## 2020-09-30 DIAGNOSIS — O99350 Diseases of the nervous system complicating pregnancy, unspecified trimester: Secondary | ICD-10-CM | POA: Diagnosis not present

## 2020-09-30 DIAGNOSIS — Z3A35 35 weeks gestation of pregnancy: Secondary | ICD-10-CM | POA: Diagnosis not present

## 2020-09-30 DIAGNOSIS — Z3482 Encounter for supervision of other normal pregnancy, second trimester: Secondary | ICD-10-CM | POA: Diagnosis not present

## 2020-09-30 DIAGNOSIS — Z3A16 16 weeks gestation of pregnancy: Secondary | ICD-10-CM | POA: Diagnosis not present

## 2020-09-30 DIAGNOSIS — H409 Unspecified glaucoma: Secondary | ICD-10-CM | POA: Diagnosis not present

## 2020-09-30 DIAGNOSIS — Z369 Encounter for antenatal screening, unspecified: Secondary | ICD-10-CM | POA: Diagnosis not present

## 2020-10-03 DIAGNOSIS — Z419 Encounter for procedure for purposes other than remedying health state, unspecified: Secondary | ICD-10-CM | POA: Diagnosis not present

## 2020-10-07 DIAGNOSIS — Z3A35 35 weeks gestation of pregnancy: Secondary | ICD-10-CM | POA: Diagnosis not present

## 2020-10-07 DIAGNOSIS — H40812 Glaucoma with increased episcleral venous pressure, left eye: Secondary | ICD-10-CM | POA: Diagnosis not present

## 2020-10-07 DIAGNOSIS — Z3A36 36 weeks gestation of pregnancy: Secondary | ICD-10-CM | POA: Diagnosis not present

## 2020-10-07 DIAGNOSIS — O99350 Diseases of the nervous system complicating pregnancy, unspecified trimester: Secondary | ICD-10-CM | POA: Diagnosis not present

## 2020-10-09 DIAGNOSIS — O139 Gestational [pregnancy-induced] hypertension without significant proteinuria, unspecified trimester: Secondary | ICD-10-CM | POA: Diagnosis not present

## 2020-10-09 DIAGNOSIS — O368131 Decreased fetal movements, third trimester, fetus 1: Secondary | ICD-10-CM | POA: Diagnosis not present

## 2020-10-09 DIAGNOSIS — O368199 Decreased fetal movements, unspecified trimester, other fetus: Secondary | ICD-10-CM | POA: Diagnosis not present

## 2020-10-09 DIAGNOSIS — Z3A36 36 weeks gestation of pregnancy: Secondary | ICD-10-CM | POA: Diagnosis not present

## 2020-10-14 ENCOUNTER — Encounter (HOSPITAL_COMMUNITY): Payer: Self-pay | Admitting: *Deleted

## 2020-10-14 ENCOUNTER — Telehealth (HOSPITAL_COMMUNITY): Payer: Self-pay | Admitting: *Deleted

## 2020-10-14 NOTE — Telephone Encounter (Signed)
Preadmission screen  

## 2020-10-15 DIAGNOSIS — Z3A37 37 weeks gestation of pregnancy: Secondary | ICD-10-CM | POA: Diagnosis not present

## 2020-10-15 DIAGNOSIS — O365931 Maternal care for other known or suspected poor fetal growth, third trimester, fetus 1: Secondary | ICD-10-CM | POA: Diagnosis not present

## 2020-10-17 ENCOUNTER — Other Ambulatory Visit (HOSPITAL_COMMUNITY)
Admission: RE | Admit: 2020-10-17 | Discharge: 2020-10-17 | Disposition: A | Discharge status: Home / Self Care | Payer: Medicaid Other | Source: Ambulatory Visit | Attending: Obstetrics and Gynecology | Admitting: Obstetrics and Gynecology

## 2020-10-17 DIAGNOSIS — Z01812 Encounter for preprocedural laboratory examination: Secondary | ICD-10-CM | POA: Diagnosis not present

## 2020-10-17 DIAGNOSIS — Z20822 Contact with and (suspected) exposure to covid-19: Secondary | ICD-10-CM | POA: Diagnosis not present

## 2020-10-17 DIAGNOSIS — O365999 Maternal care for other known or suspected poor fetal growth, unspecified trimester, other fetus: Secondary | ICD-10-CM | POA: Diagnosis not present

## 2020-10-17 DIAGNOSIS — Z3A37 37 weeks gestation of pregnancy: Secondary | ICD-10-CM | POA: Diagnosis not present

## 2020-10-17 LAB — SARS CORONAVIRUS 2 (TAT 6-24 HRS): SARS Coronavirus 2: NEGATIVE

## 2020-10-18 LAB — OB RESULTS CONSOLE GBS: GBS: NEGATIVE

## 2020-10-18 NOTE — H&P (Signed)
Jessica Barton is a 63 y.Q.[id number] female presenting for scheduled IOL due to IUGR noted at 36 weeks ( 9th %ile).  Pt is dated per LMP which was confirmed with a 9 week [place]. 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  She has a history of preterm delivery - declined makena or progesterone. 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 They met with genetic counselor.  She is VZNI and GBS neg. MaterniT was low risk.  Hx pp depression - not on any meds.   OB History     Gravida  3   Para  2   Term  1   Preterm  1   AB  0   Living  2      SAB  0   IAB  0   Ectopic  0   Multiple  0   Live Births  2          Past Medical History:  Diagnosis Date   Glaucoma (increased eye pressure)    Normal pregnancy in multigravida in third trimester 09/20/2015   Pregnancy induced hypertension    Preterm delivery 09/20/2015   Past Surgical History:  Procedure Laterality Date   EYE SURGERY     NOSE SURGERY     Family History: family history includes Diabetes in her father; Hyperlipidemia in her father; Hypertension in her father. Social History:  reports that she has never smoked. She has never used smokeless tobacco. She reports that she does not drink alcohol and does not use drugs.     Maternal Diabetes: No Genetic Screening: Normal Maternal Ultrasounds/Referrals: IUGR Fetal Ultrasounds or other Referrals:  None Maternal Substance Abuse:  Yes:  Type: Other:  Significant Maternal Medications:  Meds include: Other: see hpi Significant Maternal Lab Results:  Group B Strep negative Other Comments:  None  Review of Systems  Constitutional:  Negative for activity change, appetite change, diaphoresis and fatigue.  Eyes:  Positive for photophobia and visual disturbance.  Respiratory:  Negative for chest tightness and shortness of breath.   Endocrine: Negative for polyphagia.  Genitourinary:   Negative for pelvic pain.  Musculoskeletal:  Positive for myalgias.  Neurological:  Negative for light-headedness and headaches.  Psychiatric/Behavioral:  Negative for sleep disturbance. The patient is nervous/anxious.   Maternal Medical History:  Reason for admission: IOL due to IUGR  Contractions: Frequency: irregular.   Perceived severity is mild.   Fetal activity: Perceived fetal activity is normal.   Prenatal complications: IUGR.   Prenatal Complications - Diabetes: none.    Last menstrual period 01/27/2020, unknown if currently breastfeeding. Maternal Exam:  Uterine Assessment: Contraction strength is mild.  Contraction frequency is irregular.  Abdomen: Estimated fetal weight is 36 2/7: EFW 9th%tile, AC 3rd%tile.   Fetal presentation: vertex Introitus: Normal vulva. Normal vagina.  Pelvis: adequate for delivery.   Cervix: Cervix evaluated by digital exam.     Fetal Exam Fetal Monitor Review: Baseline rate: 129.  Pattern: accelerations present.    Physical Exam Vitals and nursing note reviewed. Exam conducted with a chaperone present.  Constitutional:      Appearance: Normal appearance.  Cardiovascular:     Rate and Rhythm: Normal rate.     Pulses: Normal pulses.  Abdominal:     Palpations: Abdomen is soft.     Tenderness: There is abdominal tenderness.  Genitourinary:    General: Normal vulva.  Musculoskeletal:  General: Normal range of motion.     Cervical back: Normal range of motion.  Skin:    General: Skin is warm.     Capillary Refill: Capillary refill takes 2 to 3 seconds.  Neurological:     General: No focal deficit present.     Mental Status: She is alert and oriented to person, place, and time. Mental status is at baseline.  Psychiatric:        Mood and Affect: Mood normal.        Behavior: Behavior normal.        Thought Content: Thought content normal.    Prenatal labs: ABO, Rh: O/Positive/-- (12/28 0000) Antibody: Negative (12/28  0000) Rubella: Immune (12/28 0000) RPR: Nonreactive (12/28 0000)  HBsAg: Negative (12/28 0000)  HIV: Non-reactive (12/28 0000)  GBS:     Assessment/Plan: [id number] [id number] female here for scheduled IOL due to IUGR  - Admit  - Pitocin pre protocol - Pain control prn - GBS neg; Covid screen neg - Anticipate svd    Jessica Barton 10/18/2020, 11:48 AM

## 2020-10-19 ENCOUNTER — Inpatient Hospital Stay (HOSPITAL_COMMUNITY)
Admission: AD | Admit: 2020-10-19 | Discharge: 2020-10-22 | DRG: 807 | Disposition: A | Discharge status: Home / Self Care | Payer: Medicaid Other | Attending: Obstetrics and Gynecology | Admitting: Obstetrics and Gynecology

## 2020-10-19 ENCOUNTER — Encounter (HOSPITAL_COMMUNITY): Payer: Self-pay | Admitting: Obstetrics and Gynecology

## 2020-10-19 ENCOUNTER — Other Ambulatory Visit: Payer: Self-pay

## 2020-10-19 ENCOUNTER — Inpatient Hospital Stay (HOSPITAL_COMMUNITY): Payer: Medicaid Other

## 2020-10-19 DIAGNOSIS — Z3A38 38 weeks gestation of pregnancy: Secondary | ICD-10-CM

## 2020-10-19 DIAGNOSIS — Z20822 Contact with and (suspected) exposure to covid-19: Secondary | ICD-10-CM | POA: Diagnosis not present

## 2020-10-19 DIAGNOSIS — O365932 Maternal care for other known or suspected poor fetal growth, third trimester, fetus 2: Secondary | ICD-10-CM | POA: Diagnosis present

## 2020-10-19 DIAGNOSIS — O365930 Maternal care for other known or suspected poor fetal growth, third trimester, not applicable or unspecified: Secondary | ICD-10-CM | POA: Diagnosis not present

## 2020-10-19 LAB — TYPE AND SCREEN
ABO/RH(D): O POS
Antibody Screen: NEGATIVE

## 2020-10-19 LAB — CBC
HCT: 36.1 % (ref 36.0–46.0)
Hemoglobin: 12.2 g/dL (ref 12.0–15.0)
MCH: 30 pg (ref 26.0–34.0)
MCHC: 33.8 g/dL (ref 30.0–36.0)
MCV: 88.7 fL (ref 80.0–100.0)
Platelets: 210 10*3/uL (ref 150–400)
RBC: 4.07 MIL/uL (ref 3.87–5.11)
RDW: 14.1 % (ref 11.5–15.5)
WBC: 5.6 10*3/uL (ref 4.0–10.5)
nRBC: 0 % (ref 0.0–0.2)

## 2020-10-19 MED ORDER — PHENYLEPHRINE 40 MCG/ML (10ML) SYRINGE FOR IV PUSH (FOR BLOOD PRESSURE SUPPORT)
80.0000 ug | PREFILLED_SYRINGE | INTRAVENOUS | Status: AC | PRN
  Administered 2020-10-20 (×3): 80 ug via INTRAVENOUS

## 2020-10-19 MED ORDER — FENTANYL-BUPIVACAINE-NACL 0.5-0.125-0.9 MG/250ML-% EP SOLN
12.0000 mL/h | EPIDURAL | Status: DC | PRN
  Administered 2020-10-20: 12 mL/h via EPIDURAL
  Filled 2020-10-19: qty 250

## 2020-10-19 MED ORDER — BUTORPHANOL TARTRATE 1 MG/ML IJ SOLN: 1.0000 mg | INTRAMUSCULAR | Status: DC | PRN

## 2020-10-19 MED ORDER — PHENYLEPHRINE 40 MCG/ML (10ML) SYRINGE FOR IV PUSH (FOR BLOOD PRESSURE SUPPORT)
80.0000 ug | PREFILLED_SYRINGE | INTRAVENOUS | Status: DC | PRN
  Administered 2020-10-20 (×2): 80 ug via INTRAVENOUS
  Filled 2020-10-19: qty 10

## 2020-10-19 MED ORDER — LACTATED RINGERS IV SOLN: INTRAVENOUS | Status: DC

## 2020-10-19 MED ORDER — ACETAMINOPHEN 325 MG PO TABS
650.0000 mg | ORAL_TABLET | ORAL | Status: DC | PRN
  Administered 2020-10-20: 650 mg via ORAL
  Filled 2020-10-19: qty 2

## 2020-10-19 MED ORDER — SOD CITRATE-CITRIC ACID 500-334 MG/5ML PO SOLN: 30.0000 mL | ORAL | Status: DC | PRN

## 2020-10-19 MED ORDER — OXYTOCIN-SODIUM CHLORIDE 30-0.9 UT/500ML-% IV SOLN: 2.5000 [IU]/h | INTRAVENOUS | Status: DC

## 2020-10-19 MED ORDER — EPHEDRINE 5 MG/ML INJ
10.0000 mg | INTRAVENOUS | Status: DC | PRN
  Filled 2020-10-19: qty 10

## 2020-10-19 MED ORDER — OXYTOCIN BOLUS FROM INFUSION
333.0000 mL | Freq: Once | INTRAVENOUS | Status: AC
  Administered 2020-10-20: 333 mL via INTRAVENOUS

## 2020-10-19 MED ORDER — LIDOCAINE HCL (PF) 1 % IJ SOLN: 30.0000 mL | INTRAMUSCULAR | Status: DC | PRN

## 2020-10-19 MED ORDER — LACTATED RINGERS IV SOLN: 500.0000 mL | Freq: Once | INTRAVENOUS | Status: DC

## 2020-10-19 MED ORDER — EPHEDRINE 5 MG/ML INJ: 10.0000 mg | INTRAVENOUS | Status: DC | PRN

## 2020-10-19 MED ORDER — LACTATED RINGERS IV SOLN: 500.0000 mL | INTRAVENOUS | Status: DC | PRN

## 2020-10-19 MED ORDER — DIPHENHYDRAMINE HCL 50 MG/ML IJ SOLN: 12.5000 mg | INTRAMUSCULAR | Status: DC | PRN

## 2020-10-19 MED ORDER — ONDANSETRON HCL 4 MG/2ML IJ SOLN
4.0000 mg | Freq: Four times a day (QID) | INTRAMUSCULAR | Status: DC | PRN
  Administered 2020-10-20: 4 mg via INTRAVENOUS
  Filled 2020-10-19 (×2): qty 2

## 2020-10-19 MED ORDER — TERBUTALINE SULFATE 1 MG/ML IJ SOLN: 0.2500 mg | Freq: Once | INTRAMUSCULAR | Status: DC | PRN

## 2020-10-19 MED ORDER — OXYTOCIN-SODIUM CHLORIDE 30-0.9 UT/500ML-% IV SOLN
1.0000 m[IU]/min | INTRAVENOUS | Status: DC
  Administered 2020-10-19: 2 m[IU]/min via INTRAVENOUS
  Filled 2020-10-19: qty 500

## 2020-10-19 NOTE — Progress Notes (Signed)
Patient ID: Jessica Barton, female   DOB: [birth year], 30 y.o.   MRN: [id number] Pt with no complaints except anxious about delivery as expected.  +Fms. She reports occasional contractions only. She was to come in this am but due to staffing and busy L/D was just called in.   GEN - NAD EFM - cat 1, 140 TOCO - irritability noted SVE - 3/60/-2  A/P: 33ID [id number] female with IUGR here for IOL - stable         AROM performed with scant return of fluid         Plan to augment with pitocin per protocol         May have epidural when requests         EFM/TOCO         GBS neg/ Covid neg         Anticipate svd

## 2020-10-20 ENCOUNTER — Inpatient Hospital Stay (HOSPITAL_COMMUNITY): Payer: Medicaid Other | Admitting: Anesthesiology

## 2020-10-20 ENCOUNTER — Encounter (HOSPITAL_COMMUNITY): Payer: Self-pay | Admitting: Obstetrics and Gynecology

## 2020-10-20 DIAGNOSIS — O134 Gestational [pregnancy-induced] hypertension without significant proteinuria, complicating childbirth: Secondary | ICD-10-CM | POA: Diagnosis not present

## 2020-10-20 DIAGNOSIS — Z3A38 38 weeks gestation of pregnancy: Secondary | ICD-10-CM | POA: Diagnosis not present

## 2020-10-20 DIAGNOSIS — O365932 Maternal care for other known or suspected poor fetal growth, third trimester, fetus 2: Secondary | ICD-10-CM | POA: Diagnosis not present

## 2020-10-20 LAB — CBC
HCT: 37.3 % (ref 36.0–46.0)
Hemoglobin: 12.6 g/dL (ref 12.0–15.0)
MCH: 30 pg (ref 26.0–34.0)
MCHC: 33.8 g/dL (ref 30.0–36.0)
MCV: 88.8 fL (ref 80.0–100.0)
Platelets: 168 10*3/uL (ref 150–400)
RBC: 4.2 MIL/uL (ref 3.87–5.11)
RDW: 14.2 % (ref 11.5–15.5)
WBC: 7.1 10*3/uL (ref 4.0–10.5)
nRBC: 0 % (ref 0.0–0.2)

## 2020-10-20 LAB — RPR: RPR Ser Ql: NONREACTIVE

## 2020-10-20 MED ORDER — WITCH HAZEL-GLYCERIN EX PADS: 1.0000 "application " | MEDICATED_PAD | CUTANEOUS | Status: DC | PRN

## 2020-10-20 MED ORDER — ACETAMINOPHEN 325 MG PO TABS
650.0000 mg | ORAL_TABLET | ORAL | Status: DC | PRN
  Administered 2020-10-20: 650 mg via ORAL
  Filled 2020-10-20: qty 2

## 2020-10-20 MED ORDER — DIPHENHYDRAMINE HCL 25 MG PO CAPS: 25.0000 mg | ORAL_CAPSULE | Freq: Four times a day (QID) | ORAL | Status: DC | PRN

## 2020-10-20 MED ORDER — METHYLERGONOVINE MALEATE 0.2 MG/ML IJ SOLN: 0.2000 mg | INTRAMUSCULAR | Status: DC | PRN

## 2020-10-20 MED ORDER — OXYCODONE HCL 5 MG PO TABS: 10.0000 mg | ORAL_TABLET | ORAL | Status: DC | PRN

## 2020-10-20 MED ORDER — PRENATAL MULTIVITAMIN CH
1.0000 | ORAL_TABLET | Freq: Every day | ORAL | Status: DC
  Administered 2020-10-20 – 2020-10-22 (×3): 1 via ORAL
  Filled 2020-10-20 (×3): qty 1

## 2020-10-20 MED ORDER — OXYCODONE HCL 5 MG PO TABS: 5.0000 mg | ORAL_TABLET | ORAL | Status: DC | PRN

## 2020-10-20 MED ORDER — METHYLERGONOVINE MALEATE 0.2 MG PO TABS
0.2000 mg | ORAL_TABLET | ORAL | Status: DC | PRN
Start: 2020-10-20 — End: 2020-10-22

## 2020-10-20 MED ORDER — DORZOLAMIDE HCL-TIMOLOL MAL 2-0.5 % OP SOLN
1.0000 [drp] | Freq: Two times a day (BID) | OPHTHALMIC | Status: DC
  Administered 2020-10-20 – 2020-10-22 (×5): 1 [drp] via OPHTHALMIC
  Filled 2020-10-20: qty 10

## 2020-10-20 MED ORDER — MAGNESIUM HYDROXIDE 400 MG/5ML PO SUSP: 30.0000 mL | ORAL | Status: DC | PRN

## 2020-10-20 MED ORDER — LIDOCAINE HCL (PF) 1 % IJ SOLN
INTRAMUSCULAR | Status: DC | PRN
  Administered 2020-10-20: 10 mL via EPIDURAL

## 2020-10-20 MED ORDER — SENNOSIDES-DOCUSATE SODIUM 8.6-50 MG PO TABS
2.0000 | ORAL_TABLET | Freq: Every day | ORAL | Status: DC
  Administered 2020-10-21 – 2020-10-22 (×2): 2 via ORAL
  Filled 2020-10-20 (×2): qty 2

## 2020-10-20 MED ORDER — ONDANSETRON HCL 4 MG PO TABS: 4.0000 mg | ORAL_TABLET | ORAL | Status: DC | PRN

## 2020-10-20 MED ORDER — ONDANSETRON HCL 4 MG/2ML IJ SOLN: 4.0000 mg | INTRAMUSCULAR | Status: DC | PRN

## 2020-10-20 MED ORDER — ZOLPIDEM TARTRATE 5 MG PO TABS: 5.0000 mg | ORAL_TABLET | Freq: Every evening | ORAL | Status: DC | PRN

## 2020-10-20 MED ORDER — SIMETHICONE 80 MG PO CHEW: 80.0000 mg | CHEWABLE_TABLET | ORAL | Status: DC | PRN

## 2020-10-20 MED ORDER — IBUPROFEN 600 MG PO TABS
600.0000 mg | ORAL_TABLET | Freq: Four times a day (QID) | ORAL | Status: DC
  Administered 2020-10-20 – 2020-10-22 (×9): 600 mg via ORAL
  Filled 2020-10-20 (×9): qty 1

## 2020-10-20 MED ORDER — DIBUCAINE (PERIANAL) 1 % EX OINT: 1.0000 "application " | TOPICAL_OINTMENT | CUTANEOUS | Status: DC | PRN

## 2020-10-20 MED ORDER — MEASLES, MUMPS & RUBELLA VAC IJ SOLR: 0.5000 mL | Freq: Once | INTRAMUSCULAR | Status: DC

## 2020-10-20 MED ORDER — DORZOLAMIDE HCL-TIMOLOL MAL PF 2-0.5 % OP SOLN: 1.0000 [drp] | Freq: Two times a day (BID) | OPHTHALMIC | Status: DC

## 2020-10-20 MED ORDER — BENZOCAINE-MENTHOL 20-0.5 % EX AERO: 1.0000 "application " | INHALATION_SPRAY | CUTANEOUS | Status: DC | PRN

## 2020-10-20 MED ORDER — APRACLONIDINE HCL 0.5 % OP SOLN
1.0000 [drp] | Freq: Three times a day (TID) | OPHTHALMIC | Status: DC
  Administered 2020-10-20 – 2020-10-21 (×2): 1 [drp] via OPHTHALMIC
  Filled 2020-10-20: qty 5

## 2020-10-20 MED ORDER — TETANUS-DIPHTH-ACELL PERTUSSIS 5-2.5-18.5 LF-MCG/0.5 IM SUSY: 0.5000 mL | PREFILLED_SYRINGE | Freq: Once | INTRAMUSCULAR | Status: DC

## 2020-10-20 MED ORDER — COCONUT OIL OIL
1.0000 "application " | TOPICAL_OIL | Status: DC | PRN
  Administered 2020-10-21: 1 via TOPICAL

## 2020-10-20 NOTE — Lactation Note (Addendum)
This note was copied from a baby's chart. Lactation Consultation Note  Patient Name: Jessica Barton Date: 10/20/2020 Reason for consult: L&D Initial assessment;1st time breastfeeding;Early term 37-38.6wks Age:28 hours  Mom is a P3, but this is her 1st time breastfeeding. Mom's nipples have a slightly larger diameter; latching was attempted, but infant could not sustain latch.  Hand expression was done with Mom to her level of tolerance & infant was spoon-fed. "Saif" extended tongue very well with spoon-feeding.   Lactation to f/u later. Mom has WIC & is aware that [place] is not halal. She stated that she had bought some Enfamil formula for home use, if needed.   Lurline Hare [facility] 10/20/2020, 9:26 AM

## 2020-10-20 NOTE — Anesthesia Postprocedure Evaluation (Signed)
Anesthesia Post Note  Patient: Jessica Barton  Procedure(s) Performed: AN AD HOC LABOR EPIDURAL     Patient location during evaluation: Mother Baby Anesthesia Type: Epidural Level of consciousness: awake and alert Pain management: pain level controlled Vital Signs Assessment: post-procedure vital signs reviewed and stable Respiratory status: spontaneous breathing, nonlabored ventilation and respiratory function stable Cardiovascular status: stable Postop Assessment: no headache, no backache, epidural receding, patient able to bend at knees, no apparent nausea or vomiting and able to ambulate Anesthetic complications: no   No notable events documented.  Last Vitals:  Vitals:   10/20/20 1011 10/20/20 1158  BP: 122/66 123/77  Pulse: (!) 118 (!) 116  Resp: 18 18  Temp: 37.4 C 37 C  SpO2: 98% 99%    Last Pain:  Vitals:   10/20/20 1439  TempSrc:   PainSc: 0-No pain   Pain Goal:                   Blythe Stanford

## 2020-10-20 NOTE — Anesthesia Procedure Notes (Signed)
Epidural Patient location during procedure: OB Start time: 10/20/2020 12:40 AM End time: 10/20/2020 12:51 AM  Staffing Anesthesiologist: Lucretia Kern, MD Performed: anesthesiologist   Preanesthetic Checklist Completed: patient identified, IV checked, risks and benefits discussed, monitors and equipment checked, pre-op evaluation and timeout performed  Epidural Patient position: sitting Prep: DuraPrep Patient monitoring: heart rate, continuous pulse ox and blood pressure Approach: midline Location: L3-L4 Injection technique: LOR air  Needle:  Needle type: Tuohy  Needle gauge: 17 G Needle length: 9 cm Needle insertion depth: 5 cm Catheter type: closed end flexible Catheter size: 19 Gauge Catheter at skin depth: 10 cm Test dose: negative  Assessment Events: blood not aspirated, injection not painful, no injection resistance, no paresthesia and negative IV test  Additional Notes Reason for block:procedure for pain

## 2020-10-20 NOTE — Progress Notes (Signed)
Patient ID: Jessica Barton, female   DOB: 1990-06-06, 30 y.o.   MRN: [id number] Pt received epidural around 1am. She is comfortable but still very anxious/nervous. Pain well controlled. C/O nausea VSS GEN - anxious but NAD EFM - 115, cat 1 TOCO - contractions q 2-84mins SVE - 5/80/-2  A/P: [id number] at 38 1/7wks with IUGR        - Progressing in labor on pitocin after AROM ; pitocin per protocol        - IUPC nad FSE placed for better monitoring        - Offered zofran for nausea        - Declines vistaril for anxiety        - Anticipate svd

## 2020-10-20 NOTE — Lactation Note (Signed)
This note was copied from a baby's chart. Lactation Consultation Note  Patient Name: Jessica Barton [id number] Date: 10/20/2020 Reason for consult: Term;Initial assessment Age:28 hours  LC not able to continue assessment, mother preparing to give infant a bath.  We talked about feeding cues, use of DEBP or manual pump for increase stimulation, feeding cues and how to get a deep latch.   Mom stated work on breastfeeding and did not want to use a pump at this time. LC instructed Mom to call for latch assistance with LC present for next feeding. [name] and not be as active at breast important to observe a feeding and see how well he transfers milk and if needed offer supplementation.   Mom aware to keep infant STS, hat on all times and keep total feeding time under 30 mins.   Plan 1. To feed based on cues 8-12x in 24 hr period no more than 3 hrs without an attempt.  2. Mom to offer both breasts and look for signs of milk transfer 3. Mom to offer EBM via spoon if infant not able to latch.  4 I and O sheet reviewed.  5. LC brochure of inpatient and outpatient services provided.  All questions answered at the end of the visit.   Maternal Data Has patient been taught Hand Expression?: Yes  Feeding Mother's Current Feeding Choice: Breast Milk  LATCH Score Latch: Repeated attempts needed to sustain latch, nipple held in mouth throughout feeding, stimulation needed to elicit sucking reflex.  Audible Swallowing: A few with stimulation  Type of Nipple: Everted at rest and after stimulation  Comfort (Breast/Nipple): Filling, red/small blisters or bruises, mild/mod discomfort  Hold (Positioning): Assistance needed to correctly position infant at breast and maintain latch.  LATCH Score: 6   Lactation Tools Discussed/Used    Interventions Interventions: Breast feeding basics reviewed;Support pillows;Education;Position options;Skin to skin;Expressed milk;Hand  express;Breast compression  Discharge    Consult Status Consult Status: Follow-up Date: 10/21/20 Follow-up type: In-patient    Jessica Barton  Jessica Barton 10/20/2020, 9:15 PM

## 2020-10-20 NOTE — Anesthesia Preprocedure Evaluation (Signed)
Anesthesia Evaluation  Patient identified by MRN, date of birth, ID band Patient awake    Reviewed: Allergy & Precautions, H&P , NPO status , Patient's Chart, lab work & pertinent test results  History of Anesthesia Complications Negative for: history of anesthetic complications  Airway Mallampati: II  TM Distance: >3 FB     Dental   Pulmonary neg pulmonary ROS,    Pulmonary exam normal        Cardiovascular hypertension (PIH),  Rhythm:regular Rate:Normal     Neuro/Psych negative neurological ROS  negative psych ROS   GI/Hepatic negative GI ROS, Neg liver ROS,   Endo/Other  negative endocrine ROS  Renal/GU negative Renal ROS  negative genitourinary   Musculoskeletal   Abdominal   Peds  Hematology negative hematology ROS (+)   Anesthesia Other Findings   Reproductive/Obstetrics (+) Pregnancy                            Anesthesia Physical Anesthesia Plan  ASA: 2  Anesthesia Plan: Epidural   Post-op Pain Management:    Induction:   PONV Risk Score and Plan:   Airway Management Planned:   Additional Equipment:   Intra-op Plan:   Post-operative Plan:   Informed Consent: I have reviewed the patients History and Physical, chart, labs and discussed the procedure including the risks, benefits and alternatives for the proposed anesthesia with the patient or authorized representative who has indicated his/her understanding and acceptance.       Plan Discussed with:   Anesthesia Plan Comments:         Anesthesia Quick Evaluation

## 2020-10-20 NOTE — Progress Notes (Signed)
Patient ID: Jessica Barton, female   DOB: 01-10-1991, 30 y.o.   MRN: [id number] Pt doing well. No new complaints. +FMs  MVUs at 180; ctxs q 2-3 mins  SVE 6/90/-2 Cat 1, occ early decelerations, 120  A/P: Progressing in labor on pitocin          Expectant mgmt

## 2020-10-21 NOTE — Social Work (Signed)
CSW received consult for hx of postpartum depression x2. CSW met with MOB to offer support and complete assessment.    CSW met with MOB at bedside. CSW observed MOB eating breakfast, and stated the infant was out for  a circumcision procedure. CSW introduced role and congratulated MOB. MOB appeared calm and receptive to CSW visit. CSW inquired how MOB has felt since giving birth. MOB expressed,  " I am feeling pretty good." CSW inquired about MOB history of postpartum depression. MOB disclosed that she experienced postpartum depression with both older children. MOB explained the postpartum set in immediately after giving birth and lasted for month. MOB reported feeling fatigued and very tearful. MOB reported she did not reach out to a medical provider because the symptoms went away after a month.  MOB reported she has been feeling a lot better this time. CSW inquired about MOB supports. MOB acknowledged her spouse and mother as supports. CSW encouraged MOB to use her supports especially during this time. MOB reported her spouse and mother will help care for the infant and older children. CSW inquired if MOB has received therapy/medication treatment. MOB reported no history of medication or therapy treatment.CSW inquired about MOB coping mechanisms. MOB disclosed that she asks for help when feeling overwhelmed. CSW praised MOB coping mechanisms. CSW provided education regarding the baby blues period vs. perinatal mood disorders, discussed treatment and gave resources for mental health follow up if concerns arise. CSW recommended self-evaluation during the postpartum time period using the New Mom Checklist from Postpartum Progress and encouraged MOB to contact a medical professional if symptoms are noted at any time. MOB reported she feels comfortable reaching out to her physician if her she experiences postpartum depression and it gets to a point she can not handle it. CSW assessed MOB for safety. MOB denied  thoughts of harm to self and others.   CSW provided review of Sudden Infant Death Syndrome (SIDS) precautions.MOB reported the infant will sleep in a bassinet. MOB reported she has all essential items for the infant.  MOB has chosen [facility] for infant's follow up care. MOB confirmed she has transportation to appointments.   CSW identifies no further need for intervention and no barriers to discharge at this time.   Tayon Parekh, MSW, LCSW [facility]  Clinical Social Worker  [phone] 10/21/2020  10:02 AM  

## 2020-10-21 NOTE — Progress Notes (Signed)
POSTPARTUM PROGRESS NOTE  Post Partum Day #1  Subjective:  No acute events overnight.  Pt denies problems with ambulating, voiding or po intake.  She denies nausea or vomiting.  Pain is well controlled.    Lochia Minimal. Would like to go home if possible, reviewed peds is monitoring baby's bili  Objective: Blood pressure 107/80, pulse (!) 101, temperature 98 F (36.7 C), temperature source Oral, resp. rate 18, height 5\' 4"  (1.626 m), weight 93.7 kg, last menstrual period 01/27/2020, SpO2 99 %, unknown if currently breastfeeding.  Physical Exam:  General: alert, cooperative and no distress Lochia:normal flow Chest: CTAB Heart: RRR no m/r/g Abdomen: +BS, soft, nontender Uterine Fundus: firm, 1cm below umbilicus GU: suture intact, healing well, no purulent drainage Extremities: neg edema, neg calf TTP BL, neg Homans BL  Recent Labs    10/19/20 1739 10/20/20 0019  HGB 12.2 12.6  HCT 36.1 37.3    Assessment/Plan:  ASSESSMENT: Jessica Barton is a 30 y.o. 34 s/p SVD @ [redacted]w[redacted]d. PNC c/b IUGR @ 9th%tile, glaucoma - used dorzolamide/timolol.   Breastfeeding and Lactation consult Baby boy circ'ed this AM - pt cleared for discharge from OB standpoint, pending baby clearance otherwise keep until tomorrow AM   LOS: 2 days

## 2020-10-21 NOTE — Lactation Note (Signed)
This note was copied from a baby's chart. Lactation Consultation Note Mom states she isn't putting the baby to the breast any more d/t severe pain. Mom has abrasions to Lt nipple and bruising to Rt. Nipple. Mom states she can't tolerate the pain. For now she is pumping. Mom has DEBP at bedside but chooses to use hand pump. Mom stated she gets more milk out from hand pumping than using DEBP.  Mom stated her milk usually comes in on the 3rd day. Assessed mom's breast noted some fullness and a couple small knots. Mom has tubular breast w/short shaft areolas.  Mom has coconut oil. Encouraged to wear it when pumping. Shells given. Encouraged to give BM first and separate and hand express after pumping. Mom stated it hurts. LC reminded mom so does engorgement try not to let breast get engorged.  Gave mom information on how much to feed baby if not going to the breast.  Baby's lips look dry. Asked mom to call for [facility] before feeding baby again for [facility] to check suck assessment.  Patient Name: Jessica Barton MEQAS'T Date: 10/21/2020 Reason for consult: Follow-up assessment;Difficult latch;Nipple pain/trauma;Early term 37-38.6wks Age:28 hours  Maternal Data    Feeding Mother's Current Feeding Choice: Breast Milk and Formula Nipple Type: Extra Slow Flow  LATCH Score          Comfort (Breast/Nipple): Engorged, cracked, bleeding, large blisters, severe discomfort         Lactation Tools Discussed/Used Tools: Coconut oil  Interventions Interventions: Coconut oil;Shells;DEBP;Hand pump  Discharge    Consult Status Consult Status: Follow-up Date: 10/22/20 Follow-up type: In-patient    Charyl Dancer 10/21/2020, 10:44 PM

## 2020-10-21 NOTE — Lactation Note (Signed)
This note was copied from a baby's chart. Lactation Consultation Note  Patient Name: Jessica Barton [id number] Date: 10/21/2020 Reason for consult: Mother's request;Difficult latch;Follow-up assessment;Early term 37-38.6wks Age:28 hours, infant had 2 voids and 3 stools. Per mom, she had pain with latches and infant easily falls asleep at the breast. Mom doesn't want to use pump at this time. Per mom, she knows how to hand express. LC discussed importance of breastfeeding infant STS and un-swaddled. Mom latched infant on her left breast using the football hold position, infant latched with depth, swallows heard, infant was still breastfeeding after 15 minutes when LC left the room.  LC discussed with mom to do breastfeeding stimulate techniques to keep infant awake while breastfeeding such as: talking to infant, gently massaging infant's neck and shoulder, doing breast compressions and BF infant STS.  Mom knows after latching infant at the breast, she can hand express and give infant her EBM by spoon for extra volume. Mom knows to BF infant according to hunger cues, 8 to 12+ or more times within 24 hours, STS. Mom knows to call RN or LC if she has any BF questions, concerns or need further assistance with latching infant at the breast.  Maternal Data Has patient been taught Hand Expression?: Yes  Feeding Mother's Current Feeding Choice: Breast Milk  LATCH Score Latch: Grasps breast easily, tongue down, lips flanged, rhythmical sucking.  Audible Swallowing: Spontaneous and intermittent  Type of Nipple: Everted at rest and after stimulation  Comfort (Breast/Nipple): Soft / non-tender  Hold (Positioning): Assistance needed to correctly position infant at breast and maintain latch.  LATCH Score: 9   Lactation Tools Discussed/Used    Interventions Interventions: Hand express;Adjust position;Support pillows;Position options;Expressed milk;Breast compression  Discharge    Consult  Status Consult Status: Follow-up Date: 10/21/20 Follow-up type: In-patient    Danelle Earthly 10/21/2020, 12:23 AM

## 2020-10-21 NOTE — Lactation Note (Addendum)
This note was copied from a baby's chart. Lactation Consultation Note  Patient Name: Jessica Barton [id number] Date: 10/21/2020 Reason for consult: Follow-up assessment Age:28 hours  LC was  paged to assist with latch. Infant awake.  Assist mother with positioning infant in football hold and in cross cradle hold for comfort. Mother preferred the football hold on the RT breast. Infant latched but mother reports severe pain. Attempt to flange infants lips and mother removed infant from the breast very quickly. She reports that she is a pain scale of #8 when infant latches.  Several more attempts at mothers request . Mother unable to tolerate infant latching. Infant has a high palate. Infant has some lingual frenula restrictions .   A # 24 NS was used to protect mothers nipple . Infant latched but unable to flange lips, mother couldn't tolerate latch.  Infant was spoon and cup fed 8 ml of ebm .  Discussed in depth how mother plans to feed infant. She reports that she wants to breast feed infant. She reports that she doesn't like to hand express and she doesn't like the hand pump.  Staff nurse Randa Evens to sat up DEBP and give instructions. Mother reports that she will try to use the DEBP  Plan of Care : Breastfeed infant with feeding cues Hand express and give infant with a spoon or cup Pump using a DEBP after each feeding for 15-20 mins.   Mother to continue to cue base feed infant and feed at least 8-12 times or more in 24 hours and advised to allow for cluster feeding infant as needed.  Encouraged mother to see [facility] when discharged.   Mother to continue to due STS. Mother is aware of available LC services at [facility], BFSG'S, OP Dept, and phone # for questions or concerns about breastfeeding.  Mother receptive to all teaching and plan of care.     Maternal Data    Feeding Mother's Current Feeding Choice: Breast Milk  LATCH Score                    Lactation Tools Discussed/Used     Interventions    Discharge    Consult Status Consult Status: Follow-up Date: 10/21/20 Follow-up type: In-patient    Stevan Born [facility] 10/21/2020, 3:58 PM

## 2020-10-21 NOTE — Lactation Note (Signed)
This note was copied from a baby's chart. Lactation Consultation Note  Patient Name: Jessica Barton [id number] Date: 10/21/2020 Reason for consult: Follow-up assessment Age:28 Hours Mother reports that infant was sleeping from circumcision. Infant is 38 weeks . Discussed that early term behaviors and that infant would need to be rouse and feed and follow the handout on supplementing infant.  Assist mother with hand expression.  She has bilateral positional strips. Mother has a bifurcated left nipple that is larger than the right. She reports pain with the latch.   Mother has expressed  milk at the bedside in a spoon for infant when he wake. Mother reports she just tried to wake him less than an hour ago.  Discussed using a hand pump .mother agreeable to using a hand pump. Mother was given a harmony hand pump with instructions. #27 flange was more comfortable for mother.  Mother to page for [facility] when infant is rooting or with in the next hour.    Maternal Data    Feeding Mother's Current Feeding Choice: Breast Milk Nipple Type:  (attempt mother unable to tolerate pain from latch)  LATCH Score                    Lactation Tools Discussed/Used Tools: Nipple Shields;Pump;Comfort gels;Flanges Nipple shield size: 24 Flange Size: 24 Breast pump type: Double-Electric Breast Pump;Manual (optional , available if mother desires to stimulate milk volume due to poor latching.) Pump Education: Setup, frequency, and cleaning;Milk Storage Reason for Pumping: poor latch  Interventions    Discharge    Consult Status Consult Status: Follow-up Date: 10/21/20 Follow-up type: In-patient    Stevan Born [facility] 10/21/2020, 4:33 PM

## 2020-10-22 MED ORDER — IBUPROFEN 600 MG PO TABS: 600.0000 mg | ORAL_TABLET | Freq: Four times a day (QID) | ORAL | 0 refills | Status: DC

## 2020-10-22 MED ORDER — ACETAMINOPHEN 325 MG PO TABS: 650.0000 mg | ORAL_TABLET | ORAL | 1 refills | Status: DC | PRN

## 2020-10-22 NOTE — Lactation Note (Signed)
This note was copied from a baby's chart. Lactation Consultation Note  Patient Name: Jessica Barton [id number] Date: 10/22/2020 Reason for consult: Follow-up assessment Age:28 hours   LC Follow Up Note:  RN assisting family with packing the cart and transporting patient out of the hospital.  No LC visit completed; no charge entered.   Maternal Data    Feeding    LATCH Score                    Lactation Tools Discussed/Used    Interventions    Discharge    Consult Status Consult Status: Complete Date: 10/22/20 Follow-up type: Call as needed    Jessica Barton 10/22/2020, 1:21 PM

## 2020-10-22 NOTE — Progress Notes (Signed)
Post Partum Day 2 Subjective: up ad lib and tolerating PO, just feels tired and ready to go home  Objective: Blood pressure 122/87, pulse 86, temperature 98.5 F (36.9 C), temperature source Oral, resp. rate 18, height 5\' 4"  (1.626 m), weight 93.7 kg, last menstrual period 01/27/2020, SpO2 100 %, unknown if currently breastfeeding.  Physical Exam:  General: alert and cooperative Lochia: appropriate Uterine Fundus: firm   Recent Labs    10/19/20 1739 10/20/20 0019  HGB 12.2 12.6  HCT 36.1 37.3    Assessment/Plan: Discharge home, peds assessing baby for jaundice but has appt set up tomorrow with pediatrician if able to go    LOS: 3 days   10/22/20 10/22/2020, 9:38 AM

## 2020-10-22 NOTE — Discharge Summary (Signed)
Postpartum Discharge Summary      Patient Name: Jessica Barton DOB: 10/16/1990 MRN: [id number]  Date of admission: 10/19/2020 Delivery date:10/20/2020  Delivering provider: Jackelyn Knife, TODD  Date of discharge: 10/22/2020  Admitting diagnosis: IUGR (intrauterine growth restriction) affecting care of mother, third trimester, fetus 2 [O36.5932] Intrauterine pregnancy: [redacted]w[redacted]d     Secondary diagnosis:  Active Problems:   IUGR (intrauterine growth restriction) affecting care of mother, third trimester, fetus 2  Additional problems: none    Discharge diagnosis: Term Pregnancy Delivered                                              Post partum procedures: none Augmentation: AROM and Pitocin Complications: None  Hospital course: Induction of Labor With Vaginal Delivery   30 y.o. yo [id number] at [redacted]w[redacted]d was admitted to the hospital 10/19/2020 for induction of labor.  Indication for induction:  IUGR .  Patient had an uncomplicated labor course as follows: Membrane Rupture Time/Date: 6:20 PM ,10/19/2020   Delivery Method:Vaginal, Spontaneous  Episiotomy: None  Lacerations:  2nd degree;Perineal  Details of delivery can be found in separate delivery note.  Patient had a routine postpartum course. Patient is discharged home 10/22/20.  Newborn Data: Birth date:10/20/2020  Birth time:8:10 AM  Gender:Female  Living status:Living  Apgars:8 ,9  Weight:2736 g   Magnesium Sulfate received: No BMZ received: No Rhophylac:N/A   Physical exam  Vitals:   10/21/20 0545 10/21/20 1654 10/21/20 2017 10/22/20 0625  BP: 107/80 127/84 123/77 122/87  Pulse: (!) 101 93 94 86  Resp: 18 18 17 18   Temp: 98 F (36.7 C) 98.2 F (36.8 C) 98.7 F (37.1 C) 98.5 F (36.9 C)  TempSrc: Oral Oral Oral Oral  SpO2: 99% 99% 99% 100%  Weight:      Height:       General: alert and cooperative Lochia: appropriate Uterine Fundus: firm  Labs: Lab Results  Component Value Date   WBC 7.1 10/20/2020   HGB 12.6  10/20/2020   HCT 37.3 10/20/2020   MCV 88.8 10/20/2020   PLT 168 10/20/2020   CMP Latest Ref Rng & Units 06/30/2012  Glucose 70 - 99 mg/dL 91  BUN 6 - 23 mg/dL 9  Creatinine 07/02/2012 - 9.41 mg/dL 7.40  Sodium 8.14 - 481 mEq/L 136  Potassium 3.5 - 5.1 mEq/L 3.7  Chloride 96 - 112 mEq/L 100  CO2 19 - 32 mEq/L 25  Calcium 8.4 - 10.5 mg/dL 9.9  Total Protein 6.0 - 8.3 g/dL 7.7  Total Bilirubin 0.3 - 1.2 mg/dL 856)  Alkaline Phos 39 - 117 U/L 62  AST 0 - 37 U/L 16  ALT 0 - 35 U/L 13   Edinburgh Score: Edinburgh Postnatal Depression Scale Screening Tool 10/20/2020  I have been able to laugh and see the funny side of things. 0  I have looked forward with enjoyment to things. 0  I have blamed myself unnecessarily when things went wrong. 0  I have been anxious or worried for no good reason. 0  I have felt scared or panicky for no good reason. 0  Things have been getting on top of me. 0  I have been so unhappy that I have had difficulty sleeping. 0  I have felt sad or miserable. 0  I have been so unhappy that I have been crying. 0  The thought of harming myself has occurred to me. 0  Edinburgh Postnatal Depression Scale Total 0     After visit meds:  Allergies as of 10/22/2020       Reactions   Penicillins Rash   Has patient had a PCN reaction causing immediate rash, facial/tongue/throat swelling, SOB or lightheadedness with hypotension: no Has patient had a PCN reaction causing severe rash involving mucus membranes or skin necrosis: no Has patient had a PCN reaction that required hospitalization no Has patient had a PCN reaction occurring within the last 10 years: No If all of the above answers are "NO", then may proceed with Cephalosporin use.        Medication List     STOP taking these medications    apraclonidine 0.5 % ophthalmic solution Commonly known as: IOPIDINE   azithromycin 250 MG tablet Commonly known as: ZITHROMAX   DICLEGIS PO   Dorzolamide HCl-Timolol  Mal PF 2-0.5 % Soln       TAKE these medications    acetaminophen 325 MG tablet Commonly known as: Tylenol Take 2 tablets (650 mg total) by mouth every 4 (four) hours as needed (for pain scale < 4).   ibuprofen 600 MG tablet Commonly known as: ADVIL Take 1 tablet (600 mg total) by mouth every 6 (six) hours.   prenatal multivitamin Tabs tablet Take 1 tablet by mouth daily at 12 noon.         Discharge home in stable condition Infant Feeding: Breast Infant Disposition:home with mother Discharge instruction: per After Visit Summary and Postpartum booklet. Activity: Advance as tolerated. Pelvic rest for 6 weeks.  Diet: routine diet Future Appointments:No future appointments. Follow up Visit:  Follow-up Information     Edwinna Areola, DO. Schedule an appointment as soon as possible for a visit in 5 week(s).   Specialty: Obstetrics and Gynecology Why: Postpartum Contact information: [address] [place] [address] [place] [address] [phone]                  Please schedule this patient for a In person postpartum visit in 6 weeks with the following provider: MD. Additional Postpartum F/U:Postpartum Depression checkup   Delivery mode:  Vaginal, Spontaneous  Anticipated Birth Control:  IUD   10/22/2020 Oliver Pila, MD

## 2020-10-23 ENCOUNTER — Telehealth: Payer: Self-pay

## 2020-10-23 NOTE — Telephone Encounter (Signed)
Transition Care Management Unsuccessful Follow-up Telephone Call  Date of discharge and from where:  10/22/2020-[facility]   Attempts:  1st Attempt  Reason for unsuccessful TCM follow-up call:  Left voice message    

## 2020-10-24 NOTE — Telephone Encounter (Signed)
Transition Care Management Follow-up Telephone Call Date of discharge and from where: 10/22/2020-[facility]. How have you been since you were released from the hospital? Patient stated she is having a lot of stomach cramping.  Any questions or concerns? No  Items Reviewed: Did the pt receive and understand the discharge instructions provided? Yes  Medications obtained and verified? Yes  Other? No  Any new allergies since your discharge? No  Dietary orders reviewed? N/A Do you have support at home? Yes   Home Care and Equipment/Supplies: Were home health services ordered? not applicable If so, what is the name of the agency? N/A  Has the agency set up a time to come to the patient's home? not applicable Were any new equipment or medical supplies ordered?  No What is the name of the medical supply agency? N/A Were you able to get the supplies/equipment? not applicable Do you have any questions related to the use of the equipment or supplies? No  Functional Questionnaire: (I = Independent and D = Dependent) ADLs: I  Bathing/Dressing- I  Meal Prep- I  Eating- I  Maintaining continence- I  Transferring/Ambulation- I  Managing Meds- I  Follow up appointments reviewed:  PCP Hospital f/u appt confirmed? No   Specialist Hospital f/u appt confirmed? No  patient is waiting for call back from OBGYN to schedule follow up. Are transportation arrangements needed? No  If their condition worsens, is the pt aware to call PCP or go to the Emergency Dept.? Yes Was the patient provided with contact information for the PCP's office or ED? Yes Was to pt encouraged to call back with questions or concerns? Yes

## 2020-11-03 ENCOUNTER — Telehealth (HOSPITAL_COMMUNITY): Payer: Self-pay | Admitting: *Deleted

## 2020-11-03 DIAGNOSIS — Z419 Encounter for procedure for purposes other than remedying health state, unspecified: Secondary | ICD-10-CM | POA: Diagnosis not present

## 2020-11-03 NOTE — Telephone Encounter (Signed)
Mom reports feeling fine physically, but sad emotionally. Reports feeling this way with all of her babies. EPDS = 11 (Hospital score = 0). Will fax OB with scores. Mom reports baby is fine. No concerns about baby.Both breastfeeding and formula feeding without difficulty.  Duffy Rhody, RN 11/03/2020 at 10:10am

## 2020-12-01 DIAGNOSIS — Z30430 Encounter for insertion of intrauterine contraceptive device: Secondary | ICD-10-CM | POA: Diagnosis not present

## 2020-12-04 DIAGNOSIS — Z419 Encounter for procedure for purposes other than remedying health state, unspecified: Secondary | ICD-10-CM | POA: Diagnosis not present

## 2020-12-09 DIAGNOSIS — H4052X2 Glaucoma secondary to other eye disorders, left eye, moderate stage: Secondary | ICD-10-CM | POA: Diagnosis not present

## 2021-01-03 DIAGNOSIS — Z419 Encounter for procedure for purposes other than remedying health state, unspecified: Secondary | ICD-10-CM | POA: Diagnosis not present

## 2021-01-05 DIAGNOSIS — Z20822 Contact with and (suspected) exposure to covid-19: Secondary | ICD-10-CM | POA: Diagnosis not present

## 2021-01-05 DIAGNOSIS — J029 Acute pharyngitis, unspecified: Secondary | ICD-10-CM | POA: Diagnosis not present

## 2021-01-14 DIAGNOSIS — N3946 Mixed incontinence: Secondary | ICD-10-CM | POA: Diagnosis not present

## 2021-01-14 DIAGNOSIS — Z30431 Encounter for routine checking of intrauterine contraceptive device: Secondary | ICD-10-CM | POA: Diagnosis not present

## 2021-01-21 DIAGNOSIS — R051 Acute cough: Secondary | ICD-10-CM | POA: Diagnosis not present

## 2021-02-03 DIAGNOSIS — Z419 Encounter for procedure for purposes other than remedying health state, unspecified: Secondary | ICD-10-CM | POA: Diagnosis not present

## 2021-02-23 DIAGNOSIS — H1820 Unspecified corneal edema: Secondary | ICD-10-CM | POA: Diagnosis not present

## 2021-02-23 DIAGNOSIS — H4052X2 Glaucoma secondary to other eye disorders, left eye, moderate stage: Secondary | ICD-10-CM | POA: Diagnosis not present

## 2021-02-23 DIAGNOSIS — H18232 Secondary corneal edema, left eye: Secondary | ICD-10-CM | POA: Diagnosis not present

## 2021-02-23 DIAGNOSIS — Z961 Presence of intraocular lens: Secondary | ICD-10-CM | POA: Diagnosis not present

## 2021-03-05 DIAGNOSIS — Z419 Encounter for procedure for purposes other than remedying health state, unspecified: Secondary | ICD-10-CM | POA: Diagnosis not present

## 2021-04-01 ENCOUNTER — Ambulatory Visit: Payer: Medicaid Other | Admitting: Family Medicine

## 2021-04-05 DIAGNOSIS — Z419 Encounter for procedure for purposes other than remedying health state, unspecified: Secondary | ICD-10-CM | POA: Diagnosis not present

## 2021-05-05 DIAGNOSIS — H4052X2 Glaucoma secondary to other eye disorders, left eye, moderate stage: Secondary | ICD-10-CM | POA: Diagnosis not present

## 2021-05-06 DIAGNOSIS — Z419 Encounter for procedure for purposes other than remedying health state, unspecified: Secondary | ICD-10-CM | POA: Diagnosis not present

## 2021-05-07 DIAGNOSIS — Z30431 Encounter for routine checking of intrauterine contraceptive device: Secondary | ICD-10-CM | POA: Diagnosis not present

## 2021-05-25 DIAGNOSIS — H1812 Bullous keratopathy, left eye: Secondary | ICD-10-CM | POA: Diagnosis not present

## 2021-05-25 DIAGNOSIS — H18232 Secondary corneal edema, left eye: Secondary | ICD-10-CM | POA: Diagnosis not present

## 2021-06-03 DIAGNOSIS — Z419 Encounter for procedure for purposes other than remedying health state, unspecified: Secondary | ICD-10-CM | POA: Diagnosis not present

## 2021-06-09 ENCOUNTER — Ambulatory Visit: Payer: Medicaid Other | Admitting: Internal Medicine

## 2021-07-04 DIAGNOSIS — Z419 Encounter for procedure for purposes other than remedying health state, unspecified: Secondary | ICD-10-CM | POA: Diagnosis not present

## 2021-07-23 DIAGNOSIS — H18232 Secondary corneal edema, left eye: Secondary | ICD-10-CM | POA: Diagnosis not present

## 2021-07-23 DIAGNOSIS — Z9889 Other specified postprocedural states: Secondary | ICD-10-CM | POA: Diagnosis not present

## 2021-07-23 DIAGNOSIS — Z833 Family history of diabetes mellitus: Secondary | ICD-10-CM | POA: Diagnosis not present

## 2021-07-23 DIAGNOSIS — H18332 Rupture in Descemet's membrane, left eye: Secondary | ICD-10-CM | POA: Diagnosis not present

## 2021-07-23 DIAGNOSIS — H18322 Folds in Descemet's membrane, left eye: Secondary | ICD-10-CM | POA: Diagnosis not present

## 2021-07-23 DIAGNOSIS — Z8249 Family history of ischemic heart disease and other diseases of the circulatory system: Secondary | ICD-10-CM | POA: Diagnosis not present

## 2021-08-03 DIAGNOSIS — Z419 Encounter for procedure for purposes other than remedying health state, unspecified: Secondary | ICD-10-CM | POA: Diagnosis not present

## 2021-09-03 DIAGNOSIS — Z419 Encounter for procedure for purposes other than remedying health state, unspecified: Secondary | ICD-10-CM | POA: Diagnosis not present

## 2021-09-04 ENCOUNTER — Encounter: Payer: Self-pay | Admitting: Physical Therapy

## 2021-09-04 ENCOUNTER — Ambulatory Visit: Payer: Medicaid Other | Attending: Obstetrics and Gynecology | Admitting: Physical Therapy

## 2021-09-04 DIAGNOSIS — R279 Unspecified lack of coordination: Secondary | ICD-10-CM | POA: Insufficient documentation

## 2021-09-04 DIAGNOSIS — M6281 Muscle weakness (generalized): Secondary | ICD-10-CM | POA: Diagnosis not present

## 2021-09-04 NOTE — Therapy (Unsigned)
OUTPATIENT PHYSICAL THERAPY FEMALE PELVIC EVALUATION   Patient Name: Jessica Barton MRN: [id number] DOB:[birth year], 31 y.o., female Today's Date: 09/05/2021   PT End of Session - 09/04/21 0852     Visit Number 1    Date for PT Re-Evaluation 11/27/21    Authorization Type medicaid [employer]    PT Start Time 0849    PT Stop Time 0927    PT Time Calculation (min) 38 min    Activity Tolerance Patient tolerated treatment well    [facility] tasks assessed/performed             Past Medical History:  Diagnosis Date   Glaucoma (increased eye pressure)    Normal pregnancy in multigravida in third trimester 09/20/2015   Pregnancy induced hypertension    Preterm delivery 09/20/2015   Past Surgical History:  Procedure Laterality Date   EYE SURGERY     NOSE SURGERY     Patient Active Problem List   Diagnosis Date Noted   IUGR (intrauterine growth restriction) affecting care of mother, third trimester, fetus 2 10/19/2020   Viral upper respiratory tract infection 02/28/2018   Normal pregnancy in multigravida in third trimester 09/20/2015   Preterm delivery 09/20/2015   Normal labor 09/19/2015   Other specified indication for care or intervention related to labor and delivery, unspecified as to episode of care 10/19/2013   Third degree laceration of perineum during delivery, postpartum 10/19/2013   Supervision of normal pregnancy 08/09/2013   Uterine size date discrepancy, antepartum 07/19/2013    PCP: None per patient  REFERRING PROVIDER: Lavina HammanMeisinger, Todd, MD  REFERRING DIAG: N39.46 (ICD-10-CM) - Mixed incontinence  THERAPY DIAG:  Muscle weakness (generalized)  Unspecified lack of coordination  Rationale for Evaluation and Treatment Rehabilitation  ONSET DATE: since last pregnancy 10 months ago  SUBJECTIVE:                                                                                                                                                                                            SUBJECTIVE STATEMENT: Leaking after I go to the bathroom just a few drips every time I go to the bathroom Fluid intake: water  Patient confirms identification and approves PT to assess pelvic floor and treatment Yes   PAIN:  Are you having pain? No  PRECAUTIONS: None  WEIGHT BEARING RESTRICTIONS No  FALLS:  Has patient fallen in last 6 months? 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  PLOF: Independent  PATIENT GOALS stop leakage  PERTINENT HISTORY:  3 vaginal deliveries Sexual abuse: No  BOWEL MOVEMENT Pain with bowel movement: No Type of bowel movement:Strain No Fully empty rectum: Yes:     URINATION Pain with urination: No Fully empty bladder: Yes:   Stream: Strong Urgency: No Frequency: normal  Leakage:  just after voiding Pads: No  INTERCOURSE Pain with intercourse: no Ability to have vaginal penetration:  No  PREGNANCY Vaginal deliveries 3 Tearing Yes: 1st was the most   PROLAPSE None    OBJECTIVE:    PATIENT SURVEYS:    PFIQ-7 = 71 (UIQ  COGNITION:  Overall cognitive status: Within functional limits for tasks assessed       MUSCLE LENGTH: Hamstrings: Right 70 deg; Left 70 deg  LUMBAR SPECIAL TESTS:  Straight leg raise - negative  FUNCTIONAL TESTS:  SLS - unsteady with mild trendelenburg  GAIT:  Comments: WFL  POSTURE:  Forward head, increased lumbar lordosis and anterior pelvic tilt  LUMBARAROM/PROM  A/PROM A/PROM  eval  Flexion 80%  Extension   Right lateral flexion   Left lateral flexion   Right rotation   Left rotation    (Blank rows = not tested)  LOWER EXTREMITY ROM:WFL  0 LOWER EXTREMITY MMT:  MMT Right eval Left eval  Hip flexion    Hip extension    Hip abduction    Hip adduction     Hip internal rotation    Hip external rotation    Knee flexion    Knee extension    Ankle dorsiflexion    Ankle plantarflexion    Ankle inversion    Ankle eversion     PELVIC MMT:   MMT eval  Vaginal 3/5 x 8 sec; 3 reps of 1 sec  Internal Anal Sphincter   External Anal Sphincter   Puborectalis   Diastasis Recti   (Blank rows = not tested)        PALPATION:   General  normal                External Perineal Exam lower perineal body                             Internal Pelvic Floor right side levator avulsion  TONE: normal  PROLAPSE: none  TODAY'S TREATMENT  EVAL initial HEP   PATIENT EDUCATION:  Education details: Access Code: [email or web address] Person educated: Patient Education method: Programmer, multimedia, Demonstration, Verbal cues, and Handouts Education comprehension: verbalized understanding and returned demonstration   HOME EXERCISE PROGRAM: Access Code: [email or web address] URL: [email or web address] Date: 09/04/2021 Prepared by: Dwana Curd  Exercises - Supine Diaphragmatic Breathing  - 3 x daily - 7 x weekly - 1 sets - 10 reps  ASSESSMENT:  CLINICAL IMPRESSION: Patient is a 31 y.o. female who was seen today for physical therapy evaluation and treatment for urinary incontinence. Pt has tight lumbar and hamstrings.  She has core weakness noted with increased anterior tilt.  Pt has weak and low endurance of the pelvic floor noted above. Pt will benefit from skilled PT to address all impairments so she can return to full function without leakage and return to the gym safely.   OBJECTIVE IMPAIRMENTS decreased coordination, decreased endurance, decreased strength, increased muscle spasms, and postural dysfunction.   ACTIVITY LIMITATIONS  incontinence with toileting  PARTICIPATION LIMITATIONS: interpersonal relationship and community activity  PERSONAL FACTORS 1-2 comorbidities: vaginal tearing; 3 vaginal deliveries  are also affecting  patient's functional  outcome.   REHAB POTENTIAL: Excellent  CLINICAL DECISION MAKING: Stable/uncomplicated  EVALUATION COMPLEXITY: Low   GOALS: Goals reviewed with patient? Yes  SHORT TERM GOALS: Target date: 10/03/2021  Ind with initial HEP Baseline: Goal status: INITIAL   LONG TERM GOALS: Target date: 11/28/2021   Ind with advanced HEP Baseline:  Goal status: INITIAL  2.  Pt will be able to functional actions such as use the bathroom without leakage afterwards  Baseline:  Goal status: INITIAL  3.  Pt will have to use 0 pads or toilet paper per day  Baseline:  Goal status: INITIAL  4.  Pt will be able to safely return to the gym without increased leakage Baseline:  Goal status: INITIAL  PLAN: PT FREQUENCY: 1x/week  PT DURATION: 12 weeks  PLANNED INTERVENTIONS: Therapeutic exercises, Therapeutic activity, Neuromuscular re-education, Balance training, Gait training, Patient/Family education, Joint mobilization, Electrical stimulation, Cryotherapy, Moist heat, Taping, Biofeedback, and Manual therapy  PLAN FOR NEXT SESSION: transversus abdominus and kegel with breathing progression from supine   [employer], PT 09/05/2021, 8:43 PM

## 2021-09-29 ENCOUNTER — Ambulatory Visit: Payer: Medicaid Other | Admitting: Physical Therapy

## 2021-09-29 ENCOUNTER — Encounter: Payer: Self-pay | Admitting: Physical Therapy

## 2021-09-29 DIAGNOSIS — R279 Unspecified lack of coordination: Secondary | ICD-10-CM | POA: Diagnosis not present

## 2021-09-29 DIAGNOSIS — M6281 Muscle weakness (generalized): Secondary | ICD-10-CM | POA: Diagnosis not present

## 2021-09-29 NOTE — Therapy (Addendum)
OUTPATIENT PHYSICAL THERAPY TREATMENT NOTE   Patient Name: Jessica Barton MRN: [id number] DOB:08/08/1990, 31 y.o., female Today's Date: 09/29/2021  PCP: None per patient   REFERRING PROVIDER: Cheri Fowler, MD  END OF SESSION:   PT End of Session - 09/29/21 1035     Visit Number 2    Number of Visits 10    Date for PT Re-Evaluation 11/27/21    Authorization Type medicaid [employer] - [facility] approved 10 visits. -09/07/2021-11/06/2021-auth#[id number]    PT Start Time 1015    PT Stop Time 1055    PT Time Calculation (min) 40 min    Activity Tolerance Patient tolerated treatment well    Behavior During Therapy WFL for tasks assessed/performed             Past Medical History:  Diagnosis Date   Glaucoma (increased eye pressure)    Normal pregnancy in multigravida in third trimester 09/20/2015   Pregnancy induced hypertension    Preterm delivery 09/20/2015   Past Surgical History:  Procedure Laterality Date   EYE SURGERY     NOSE SURGERY     Patient Active Problem List   Diagnosis Date Noted   IUGR (intrauterine growth restriction) affecting care of mother, third trimester, fetus 2 10/19/2020   Viral upper respiratory tract infection 02/28/2018   Normal pregnancy in multigravida in third trimester 09/20/2015   Preterm delivery 09/20/2015   Normal labor 09/19/2015   Other specified indication for care or intervention related to labor and delivery, unspecified as to episode of care 10/19/2013   Third degree laceration of perineum during delivery, postpartum 10/19/2013   Supervision of normal pregnancy 08/09/2013   Uterine size date discrepancy, antepartum 07/19/2013    REFERRING DIAG: N39.46 (ICD-10-CM) - Mixed incontinence  THERAPY DIAG:  Muscle weakness (generalized)  Unspecified lack of coordination  Rationale for Evaluation and Treatment Rehabilitation  PERTINENT HISTORY: 3 vaginal deliveries  PRECAUTIONS: None  SUBJECTIVE: I am the same.  I am on my  period  PAIN:  Are you having pain? No   OBJECTIVE: (objective measures completed at initial evaluation unless otherwise dated)   PATIENT SURVEYS:      PFIQ-7 = 71 (UIQ)   COGNITION:            Overall cognitive status: Within functional limits for tasks assessed                              MUSCLE LENGTH: Hamstrings: Right 70 deg; Left 70 deg   LUMBAR SPECIAL TESTS:  Straight leg raise - negative   FUNCTIONAL TESTS:  SLS - unsteady with mild trendelenburg   GAIT:   Comments: WFL   POSTURE:  Forward head, increased lumbar lordosis and anterior pelvic tilt   LUMBARAROM/PROM   A/PROM A/PROM  eval  Flexion 80%  Extension    Right lateral flexion    Left lateral flexion    Right rotation    Left rotation     (Blank rows = not tested)   LOWER EXTREMITY ROM:WFL   0 LOWER EXTREMITY MMT:   MMT Right eval Left eval  Hip flexion      Hip extension      Hip abduction      Hip adduction      Hip internal rotation      Hip external rotation      Knee flexion      Knee extension  Ankle dorsiflexion      Ankle plantarflexion      Ankle inversion      Ankle eversion        PELVIC MMT:   MMT eval  Vaginal 3/5 x 8 sec; 3 reps of 1 sec  Internal Anal Sphincter    External Anal Sphincter    Puborectalis    Diastasis Recti    (Blank rows = not tested)         PALPATION:   General  normal                 External Perineal Exam lower perineal body                             Internal Pelvic Floor right side levator avulsion   TONE: normal   PROLAPSE: none   TODAY'S TREATMENT  Treatment: 09/29/21 Exercises  Kegel prone with wedge Kegel supine with block knees together - hip IR Lumbar rotation stretch Hip rotation Hip flexor stretch Standing kegel in fwd flexion Manual Lumbar STM and myofascial release Gluteal STM Lt>Rt Nuero Re-ed Education and cues for coordination of breathing and pelvic floor muscle contracting and relaxing at  appropriate times     PATIENT EDUCATION:  Education details: Access Code: [email or web address] Person educated: Patient Education method: Consulting civil engineer, Media planner, Verbal cues, and Handouts Education comprehension: verbalized understanding and returned demonstration     HOME EXERCISE PROGRAM: Access Code: [email or web address] URL: [email or web address] Date: 09/29/2021 Prepared by: Jari Favre  Exercises - Supine Diaphragmatic Breathing  - 3 x daily - 7 x weekly - 1 sets - 10 reps - Prone Pelvic Floor Contraction With Pillow  - 1 x daily - 7 x weekly - 3 sets - 10 reps - Supine Lower Trunk Rotation  - 1 x daily - 7 x weekly - 1 sets - 10 reps - 5 sec hold - Supine Hip Internal and External Rotation  - 1 x daily - 7 x weekly - 1 sets - 10 reps - 5 sec hold - Hip Flexor Stretch with Chair  - 1 x daily - 7 x weekly - 1 sets - 3 reps - 30 sec hold   ASSESSMENT:   CLINICAL IMPRESSION: Pt has been doing diaphragmatic breathing at home, no changes at this time.  Pt had some tension in lumbar and gluteals more on the left side.  Today's session focused on isolating the pelvic floor finding positions that she could really.  Pt will benefit from skilled PT to address all impairments so she can return to full function without leakage and return to the gym safely.     OBJECTIVE IMPAIRMENTS decreased coordination, decreased endurance, decreased strength, increased muscle spasms, and postural dysfunction.    ACTIVITY LIMITATIONS  incontinence with toileting   PARTICIPATION LIMITATIONS: interpersonal relationship and community activity   PERSONAL FACTORS 1-2 comorbidities: vaginal tearing; 3 vaginal deliveries  are also affecting patient's functional outcome.    REHAB POTENTIAL: Excellent   CLINICAL DECISION MAKING: Stable/uncomplicated   EVALUATION COMPLEXITY: Low     GOALS: Goals reviewed with patient? Yes   SHORT TERM GOALS: Target date: 10/03/2021   Ind with initial  HEP Baseline: Goal status: MET     LONG TERM GOALS: Target date: 11/28/2021    Ind with advanced HEP Baseline:  Goal status: INITIAL   2.  Pt will be able to functional actions such as use the bathroom without leakage afterwards  Baseline:  Goal status: INITIAL   3.  Pt will have to use 0 pads or toilet paper per day   Baseline:  Goal status: INITIAL   4.  Pt will be able to safely return to the gym without increased leakage Baseline:  Goal status: INITIAL   PLAN: PT FREQUENCY: 1x/week   PT DURATION: 12 weeks   PLANNED INTERVENTIONS: Therapeutic exercises, Therapeutic activity, Neuromuscular re-education, Balance training, Gait training, Patient/Family education, Joint mobilization, Electrical stimulation, Cryotherapy, Moist heat, Taping, Biofeedback, and Manual therapy   PLAN FOR NEXT SESSION: f/u on leaking after peeing and exercises added to HEP; transversus abdominus and kegel with breathing progression from supine     [employer], PT 09/29/2021, 10:59 AM   PHYSICAL THERAPY DISCHARGE SUMMARY  Visits from Start of Care: 2  Current functional level related to goals / functional outcomes:   See above goals Remaining deficits: See above   Education / Equipment: HEP   Patient agrees to discharge. Patient goals were not met. Patient is being discharged due to the patient's request.  Pt is too busy right now, will return when able  [facility], PT 10/26/21 2:41 PM

## 2021-10-03 DIAGNOSIS — Z419 Encounter for procedure for purposes other than remedying health state, unspecified: Secondary | ICD-10-CM | POA: Diagnosis not present

## 2021-10-08 ENCOUNTER — Ambulatory Visit: Payer: Medicaid Other | Admitting: Physical Therapy

## 2021-10-15 ENCOUNTER — Ambulatory Visit: Payer: Medicaid Other | Admitting: Physical Therapy

## 2021-10-18 ENCOUNTER — Other Ambulatory Visit: Payer: Self-pay

## 2021-10-18 ENCOUNTER — Emergency Department (HOSPITAL_BASED_OUTPATIENT_CLINIC_OR_DEPARTMENT_OTHER): Payer: Medicaid Other

## 2021-10-18 ENCOUNTER — Emergency Department (HOSPITAL_BASED_OUTPATIENT_CLINIC_OR_DEPARTMENT_OTHER)
Admission: EM | Admit: 2021-10-18 | Discharge: 2021-10-18 | Disposition: A | Discharge status: Home / Self Care | Payer: Medicaid Other | Attending: Emergency Medicine | Admitting: Emergency Medicine

## 2021-10-18 ENCOUNTER — Encounter (HOSPITAL_BASED_OUTPATIENT_CLINIC_OR_DEPARTMENT_OTHER): Payer: Self-pay | Admitting: Emergency Medicine

## 2021-10-18 DIAGNOSIS — R1084 Generalized abdominal pain: Secondary | ICD-10-CM | POA: Insufficient documentation

## 2021-10-18 DIAGNOSIS — R197 Diarrhea, unspecified: Secondary | ICD-10-CM | POA: Insufficient documentation

## 2021-10-18 DIAGNOSIS — R109 Unspecified abdominal pain: Secondary | ICD-10-CM | POA: Diagnosis not present

## 2021-10-18 LAB — COMPREHENSIVE METABOLIC PANEL
ALT: 21 U/L (ref 0–44)
AST: 14 U/L — ABNORMAL LOW (ref 15–41)
Albumin: 4.9 g/dL (ref 3.5–5.0)
Alkaline Phosphatase: 49 U/L (ref 38–126)
Anion gap: 10 (ref 5–15)
BUN: 14 mg/dL (ref 6–20)
CO2: 23 mmol/L (ref 22–32)
Calcium: 9.8 mg/dL (ref 8.9–10.3)
Chloride: 105 mmol/L (ref 98–111)
Creatinine, Ser: 0.63 mg/dL (ref 0.44–1.00)
GFR, Estimated: >60 mL/min (ref >60)
Glucose, Bld: 105 mg/dL — ABNORMAL HIGH (ref 70–99)
Potassium: 3.8 mmol/L (ref 3.5–5.1)
Sodium: 138 mmol/L (ref 135–145)
Total Bilirubin: 0.4 mg/dL (ref 0.3–1.2)
Total Protein: 7.9 g/dL (ref 6.5–8.1)

## 2021-10-18 LAB — PREGNANCY, URINE: Preg Test, Ur: NEGATIVE

## 2021-10-18 LAB — URINALYSIS, ROUTINE W REFLEX MICROSCOPIC
Bilirubin Urine: NEGATIVE
Glucose, UA: NEGATIVE mg/dL
Hgb urine dipstick: NEGATIVE
Ketones, ur: NEGATIVE mg/dL
Leukocytes,Ua: NEGATIVE
Nitrite: NEGATIVE
Specific Gravity, Urine: 1.024 (ref 1.005–1.030)
pH: 6 (ref 5.0–8.0)

## 2021-10-18 LAB — CBC
HCT: 42.8 % (ref 36.0–46.0)
Hemoglobin: 14.4 g/dL (ref 12.0–15.0)
MCH: 29 pg (ref 26.0–34.0)
MCHC: 33.6 g/dL (ref 30.0–36.0)
MCV: 86.1 fL (ref 80.0–100.0)
Platelets: 257 10*3/uL (ref 150–400)
RBC: 4.97 MIL/uL (ref 3.87–5.11)
RDW: 13 % (ref 11.5–15.5)
WBC: 6.4 10*3/uL (ref 4.0–10.5)
nRBC: 0 % (ref 0.0–0.2)

## 2021-10-18 LAB — LIPASE, BLOOD: Lipase: 25 U/L (ref 11–51)

## 2021-10-18 MED ORDER — PANTOPRAZOLE SODIUM 40 MG IV SOLR
40.0000 mg | Freq: Once | INTRAVENOUS | Status: AC
  Administered 2021-10-18: 40 mg via INTRAVENOUS
  Filled 2021-10-18: qty 10

## 2021-10-18 MED ORDER — LACTATED RINGERS IV BOLUS
1000.0000 mL | Freq: Once | INTRAVENOUS | Status: AC
  Administered 2021-10-18: 1000 mL via INTRAVENOUS

## 2021-10-18 MED ORDER — DICYCLOMINE HCL 10 MG PO CAPS: 10.0000 mg | ORAL_CAPSULE | Freq: Once | ORAL | Status: DC

## 2021-10-18 MED ORDER — ONDANSETRON HCL 4 MG/2ML IJ SOLN
4.0000 mg | Freq: Once | INTRAMUSCULAR | Status: AC
  Administered 2021-10-18: 4 mg via INTRAVENOUS
  Filled 2021-10-18: qty 2

## 2021-10-18 MED ORDER — IOHEXOL 300 MG/ML  SOLN
80.0000 mL | Freq: Once | INTRAMUSCULAR | Status: AC | PRN
  Administered 2021-10-18: 80 mL via INTRAVENOUS

## 2021-10-18 MED ORDER — KETOROLAC TROMETHAMINE 30 MG/ML IJ SOLN
30.0000 mg | Freq: Once | INTRAMUSCULAR | Status: AC
  Administered 2021-10-18: 30 mg via INTRAVENOUS
  Filled 2021-10-18: qty 1

## 2021-10-18 MED ORDER — DICYCLOMINE HCL 20 MG PO TABS: 20.0000 mg | ORAL_TABLET | Freq: Two times a day (BID) | ORAL | 0 refills | Status: DC

## 2021-10-18 MED ORDER — DICYCLOMINE HCL 10 MG PO CAPS
20.0000 mg | ORAL_CAPSULE | Freq: Once | ORAL | Status: AC
  Administered 2021-10-18: 20 mg via ORAL
  Filled 2021-10-18: qty 2

## 2021-10-18 NOTE — ED Provider Notes (Signed)
[facility] EMERGENCY DEPT  Provider Note  CSN: [id number] Arrival date & time: 10/18/21 0227  History Chief Complaint  Patient presents with   Abdominal Pain   Diarrhea    Jessica Barton is a 31 y.o. female with no significant PMH reports 2 days of persistent diffuse aching abdominal pains and frequent non bloody diarrhea. Some nausea but no vomiting and no fever. No dysuria but she has had decreased UOP.    Home Medications Prior to Admission medications   Medication Sig Start Date End Date Taking? Authorizing Provider  dicyclomine (BENTYL) 20 MG tablet Take 1 tablet (20 mg total) by mouth 2 (two) times daily. 10/18/21  Yes Pollyann Savoy, MD  acetaminophen (TYLENOL) 325 MG tablet Take 2 tablets (650 mg total) by mouth every 4 (four) hours as needed (for pain scale < 4). 10/22/20   Huel Cote, MD  ibuprofen (ADVIL) 600 MG tablet Take 1 tablet (600 mg total) by mouth every 6 (six) hours. 10/22/20   Huel Cote, MD  Prenatal Vit-Fe Fumarate-FA (PRENATAL MULTIVITAMIN) TABS tablet Take 1 tablet by mouth daily at 12 noon.    [provider]     Allergies    Penicillins   Review of Systems   Review of Systems Please see HPI for pertinent positives and negatives  Physical Exam BP 133/82   Pulse 72   Temp 98 F (36.7 C) (Oral)   Resp 18   Ht 5\' 4"  (1.626 m)   Wt 93.4 kg   LMP 09/22/2021 (Approximate)   SpO2 100%   BMI 35.36 kg/m   Physical Exam Vitals and nursing note reviewed.  Constitutional:      Appearance: Normal appearance.  HENT:     Head: Normocephalic and atraumatic.     Nose: Nose normal.     Mouth/Throat:     Mouth: Mucous membranes are moist.  Eyes:     Extraocular Movements: Extraocular movements intact.     Conjunctiva/sclera: Conjunctivae normal.  Cardiovascular:     Rate and Rhythm: Normal rate.  Pulmonary:     Effort: Pulmonary effort is normal.     Breath sounds: Normal breath sounds.  Abdominal:      General: Abdomen is flat.     Palpations: Abdomen is soft.     Tenderness: There is generalized abdominal tenderness. There is no guarding. Negative signs include Murphy's sign and McBurney's sign.  Musculoskeletal:        General: No swelling. Normal range of motion.     Cervical back: Neck supple.  Skin:    General: Skin is warm and dry.  Neurological:     General: No focal deficit present.     Mental Status: She is alert.  Psychiatric:        Mood and Affect: Mood normal.     ED Results / Procedures / Treatments   EKG None  Procedures Procedures  Medications Ordered in the ED Medications  dicyclomine (BENTYL) capsule 20 mg (has no administration in time range)  ondansetron (ZOFRAN) injection 4 mg (4 mg Intravenous Given 10/18/21 0318)  lactated ringers bolus 1,000 mL (0 mLs Intravenous Stopped 10/18/21 0423)  ketorolac (TORADOL) 30 MG/ML injection 30 mg (30 mg Intravenous Given 10/18/21 0422)  pantoprazole (PROTONIX) injection 40 mg (40 mg Intravenous Given 10/18/21 0451)  iohexol (OMNIPAQUE) 300 MG/ML solution 80 mL (80 mLs Intravenous Contrast Given 10/18/21 0458)    Initial Impression and Plan  Patient here with vague abdominal pain and diarrhea.  No vomiting. Exam is relatively benign. Will check labs, zofran and IVF. Reassess for need to do imaging but will hold off for now.   ED Course   Clinical Course as of 10/18/21 0541  Sun Oct 18, 2021  0317 CBC, CMP and lipase are normal.  [CS]  0415 UA is clear.  [CS]  0443 Labs are unremarkable and exam remains benign, but patient's pain is still not improved. Will send for CT to rule out acute process such as cholecystitis, colitis, etc.  [CS]  6222 I personally viewed the images from radiology studies and agree with radiologist interpretation: CT shows possible mild colitis, but otherwise unremarkable. Given her benign exam, normal labs and lack of fever or bloody diarrhea it is unlikely she would benefit from empiric  antibiotics. Recommend she take Imodium and/or Pepto OTC for her diarrhea. Plan Rx for Bentyl to help with her abdominal discomfort and recommend close outpatient follow up with PCP.   [CS]    Clinical Course User Index [CS] Pollyann Savoy, MD     MDM Rules/Calculators/A&P Medical Decision Making Problems Addressed: Diarrhea, unspecified type: acute illness or injury Generalized abdominal pain: acute illness or injury  Amount and/or Complexity of Data Reviewed Labs: ordered. Decision-making details documented in ED Course. Radiology: ordered and independent interpretation performed. Decision-making details documented in ED Course.  Risk Prescription drug management.    Final Clinical Impression(s) / ED Diagnoses Final diagnoses:  Diarrhea, unspecified type  Generalized abdominal pain    Rx / DC Orders ED Discharge Orders          Ordered    dicyclomine (BENTYL) 20 MG tablet  2 times daily        10/18/21 0540             Pollyann Savoy, MD 10/18/21 [phone]

## 2021-10-18 NOTE — ED Triage Notes (Signed)
  Patient comes in with 2 day hx of lower abdominal pain and diarrhea.  Patient states she has lower abdominal aching and has had 10 episodes of diarrhea since yesterday.  Patient states she feels weak and tired.  No dysuria. Pain 8/10.

## 2021-10-29 ENCOUNTER — Ambulatory Visit: Payer: Medicaid Other | Admitting: Physical Therapy

## 2021-11-02 DIAGNOSIS — N926 Irregular menstruation, unspecified: Secondary | ICD-10-CM | POA: Diagnosis not present

## 2021-11-02 DIAGNOSIS — Z130 Encounter for screening for diseases of the blood and blood-forming organs and certain disorders involving the immune mechanism: Secondary | ICD-10-CM | POA: Diagnosis not present

## 2021-11-02 DIAGNOSIS — N920 Excessive and frequent menstruation with regular cycle: Secondary | ICD-10-CM | POA: Diagnosis not present

## 2021-11-02 DIAGNOSIS — Z30431 Encounter for routine checking of intrauterine contraceptive device: Secondary | ICD-10-CM | POA: Diagnosis not present

## 2021-11-03 ENCOUNTER — Ambulatory Visit (INDEPENDENT_AMBULATORY_CARE_PROVIDER_SITE_OTHER): Payer: Self-pay | Admitting: Physician Assistant

## 2021-11-03 ENCOUNTER — Encounter: Payer: Self-pay | Admitting: Physician Assistant

## 2021-11-03 ENCOUNTER — Ambulatory Visit: Payer: Medicaid Other | Admitting: Physician Assistant

## 2021-11-03 VITALS — BP 121/83 | HR 86 | Temp 97.7°F | Ht 64.0 in | Wt 191.0 lb

## 2021-11-03 DIAGNOSIS — H409 Unspecified glaucoma: Secondary | ICD-10-CM | POA: Insufficient documentation

## 2021-11-03 DIAGNOSIS — N970 Female infertility associated with anovulation: Secondary | ICD-10-CM | POA: Insufficient documentation

## 2021-11-03 DIAGNOSIS — O09219 Supervision of pregnancy with history of pre-term labor, unspecified trimester: Secondary | ICD-10-CM | POA: Insufficient documentation

## 2021-11-03 DIAGNOSIS — Z961 Presence of intraocular lens: Secondary | ICD-10-CM | POA: Insufficient documentation

## 2021-11-03 DIAGNOSIS — N915 Oligomenorrhea, unspecified: Secondary | ICD-10-CM | POA: Insufficient documentation

## 2021-11-03 DIAGNOSIS — R42 Dizziness and giddiness: Secondary | ICD-10-CM

## 2021-11-03 DIAGNOSIS — O210 Mild hyperemesis gravidarum: Secondary | ICD-10-CM | POA: Insufficient documentation

## 2021-11-03 DIAGNOSIS — Z7689 Persons encountering health services in other specified circumstances: Secondary | ICD-10-CM

## 2021-11-03 DIAGNOSIS — R5383 Other fatigue: Secondary | ICD-10-CM

## 2021-11-03 DIAGNOSIS — O99340 Other mental disorders complicating pregnancy, unspecified trimester: Secondary | ICD-10-CM | POA: Insufficient documentation

## 2021-11-03 DIAGNOSIS — N912 Amenorrhea, unspecified: Secondary | ICD-10-CM | POA: Insufficient documentation

## 2021-11-03 DIAGNOSIS — H1820 Unspecified corneal edema: Secondary | ICD-10-CM | POA: Insufficient documentation

## 2021-11-03 NOTE — Patient Instructions (Signed)
Fatigue If you have fatigue, you feel tired all the time and have a lack of energy or a lack of motivation. Fatigue may make it difficult to start or complete tasks because of exhaustion. Occasional or mild fatigue is often a normal response to activity or life. However, long-term (chronic) or extreme fatigue may be a symptom of a medical condition such as: Depression. Not having enough red blood cells or hemoglobin in the blood (anemia). A problem with a small gland located in the lower front part of the neck (thyroid disorder). Rheumatologic conditions. These are problems related to the body's defense system (immune system). Infections, especially certain viral infections. Fatigue can also lead to negative health outcomes over time. Follow these instructions at home: Medicines Take over-the-counter and prescription medicines only as told by your health care provider. Take a multivitamin if told by your health care provider. Do not use herbal or dietary supplements unless they are approved by your health care provider. Eating and drinking  Avoid heavy meals in the evening. Eat a well-balanced diet, which includes lean proteins, whole grains, plenty of fruits and vegetables, and low-fat dairy products. Avoid eating or drinking too many products with caffeine in them. Avoid alcohol. Drink enough fluid to keep your urine pale yellow. Activity  Exercise regularly, as told by your health care provider. Use or practice techniques to help you relax, such as yoga, tai chi, meditation, or massage therapy. Lifestyle Change situations that cause you stress. Try to keep your work and personal schedules in balance. Do not use recreational or illegal drugs. General instructions Monitor your fatigue for any changes. Go to bed and get up at the same time every day. Avoid fatigue by pacing yourself during the day and getting enough sleep at night. Maintain a healthy weight. Contact a health care  provider if: Your fatigue does not get better. You have a fever. You suddenly lose or gain weight. You have headaches. You have trouble falling asleep or sleeping through the night. You feel angry, guilty, anxious, or sad. You have swelling in your legs or another part of your body. Get help right away if: You feel confused, feel like you might faint, or faint. Your vision is blurry or you have a severe headache. You have severe pain in your abdomen, your back, or the area between your waist and hips (pelvis). You have chest pain, shortness of breath, or an irregular or fast heartbeat. You are unable to urinate, or you urinate less than normal. You have abnormal bleeding from the rectum, nose, lungs, nipples, or, if you are female, the vagina. You vomit blood. You have thoughts about hurting yourself or others. These symptoms may be an emergency. Get help right away. Call 911. Do not wait to see if the symptoms will go away. Do not drive yourself to the hospital. Get help right away if you feel like you may hurt yourself or others, or have thoughts about taking your own life. Go to your nearest emergency room or: Call 911. Call the [facility] at [phone] or 988. This is open 24 hours a day. Text the Crisis Text Line at 979-823-0385. Summary If you have fatigue, you feel tired all the time and have a lack of energy or a lack of motivation. Fatigue may make it difficult to start or complete tasks because of exhaustion. Long-term (chronic) or extreme fatigue may be a symptom of a medical condition. Exercise regularly, as told by your health care provider.  Change situations that cause you stress. Try to keep your work and personal schedules in balance. This information is not intended to replace advice given to you by your health care provider. Make sure you discuss any questions you have with your health care provider. Document Revised: 01/12/2021 Document  Reviewed: 01/12/2021 Elsevier Patient Education  2023 [employer]  

## 2021-11-03 NOTE — Progress Notes (Unsigned)
New Patient Office Visit  Subjective    Patient ID: Jessica Barton, female    DOB: 1990/09/21  Age: 31 y.o. MRN: [id number]  CC:  Chief Complaint  Patient presents with   New Patient (Initial Visit)    HPI TARISSA KERIN presents to establish care. Patient has history of menorrhagia and is followed by OB/GYN. Also has a history of glaucoma and followed by ophthalmology. Patient has c/o fatigue and dizziness which have been going on since her last pregnancy last year. Reports dizziness occurs randomly and can last a couple of minutes or 2-3 hours.  No chest pain, shortness of breath, palpitations, syncope. Drinks 5 water bottles per day (16.9 fl oz). Also reports her hair is falling out so much. Patient has IUD in place [facility]) which she has had for less than 1 year (~10 months).   Outpatient Encounter Medications as of 11/03/2021  Medication Sig   acetaminophen (TYLENOL) 325 MG tablet Take 2 tablets (650 mg total) by mouth every 4 (four) hours as needed (for pain scale < 4).   ibuprofen (ADVIL) 600 MG tablet Take 1 tablet (600 mg total) by mouth every 6 (six) hours.   [DISCONTINUED] dicyclomine (BENTYL) 20 MG tablet Take 1 tablet (20 mg total) by mouth 2 (two) times daily.   [DISCONTINUED] Prenatal Vit-Fe Fumarate-FA (PRENATAL MULTIVITAMIN) TABS tablet Take 1 tablet by mouth daily at 12 noon.   No facility-administered encounter medications on file as of 11/03/2021.    Past Medical History:  Diagnosis Date   Glaucoma (increased eye pressure)    Normal pregnancy in multigravida in third trimester 09/20/2015   Pregnancy induced hypertension    Preterm delivery 09/20/2015    Past Surgical History:  Procedure Laterality Date   EYE SURGERY     NOSE SURGERY      Family History  Problem Relation Age of Onset   Diabetes Father    Hypertension Father    Hyperlipidemia Father     Social History   Socioeconomic History   Marital status: Married    Spouse name: Not on file    Number of children: Not on file   Years of education: Not on file   Highest education level: Not on file  Occupational History   Not on file  Tobacco Use   Smoking status: Never   Smokeless tobacco: Never  Vaping Use   Vaping Use: Never used  Substance and Sexual Activity   Alcohol use: No   Drug use: No   Sexual activity: Yes    Birth control/protection: None  Other Topics Concern   Not on file  Social History Narrative   Not on file   Social Determinants of Health   Financial Resource Strain: Not on file  Food Insecurity: Not on file  Transportation Needs: Not on file  Physical Activity: Not on file  Stress: Not on file  Social Connections: Not on file  Intimate Partner Violence: Not on file    ROS Review of Systems:  A fourteen system review of systems was performed and found to be positive as per HPI.     Objective    BP 121/83   Pulse 86   Temp 97.7 F (36.5 C)   Ht 5\' 4"  (1.626 m)   Wt 191 lb (86.6 kg)   LMP 09/22/2021 (Approximate)   SpO2 99%   BMI 32.79 kg/m   Physical Exam General:  Well Developed, well nourished, appropriate for stated age.  Neuro:  Alert and oriented,  extra-ocular muscles intact  HEENT:  Normocephalic, atraumatic, neck supple  Skin:  no gross rash, warm, pink. Cardiac:  RRR, S1 S2 Respiratory: CTA B/L  Vascular:  Ext warm, no cyanosis apprec.; cap RF less 2 sec. Psych:  No HI/SI, judgement and insight good, Euthymic mood. Full Affect.  {Labs (Optional):23779}    Assessment & Plan:   Problem List Items Addressed This Visit       Other   Glaucoma   Other Visit Diagnoses     Fatigue, unspecified type    -  Primary   Dizziness       Encounter to establish care           Return for CPE in 2-3 weeks and FBW including B12/Vit D in 1-3 weeks .   Mayer Masker, PA-C

## 2021-11-05 ENCOUNTER — Ambulatory Visit: Payer: Medicaid Other | Admitting: Physical Therapy

## 2021-11-12 ENCOUNTER — Other Ambulatory Visit: Payer: Self-pay

## 2021-11-12 ENCOUNTER — Ambulatory Visit: Payer: Medicaid Other | Admitting: Physical Therapy

## 2021-11-12 DIAGNOSIS — E569 Vitamin deficiency, unspecified: Secondary | ICD-10-CM

## 2021-11-12 DIAGNOSIS — Z0000 Encounter for general adult medical examination without abnormal findings: Secondary | ICD-10-CM

## 2021-11-12 DIAGNOSIS — Z130 Encounter for screening for diseases of the blood and blood-forming organs and certain disorders involving the immune mechanism: Secondary | ICD-10-CM

## 2021-11-13 ENCOUNTER — Other Ambulatory Visit: Payer: Self-pay

## 2021-11-13 DIAGNOSIS — E569 Vitamin deficiency, unspecified: Secondary | ICD-10-CM

## 2021-11-13 DIAGNOSIS — Z13228 Encounter for screening for other metabolic disorders: Secondary | ICD-10-CM

## 2021-11-13 DIAGNOSIS — Z0000 Encounter for general adult medical examination without abnormal findings: Secondary | ICD-10-CM

## 2021-11-14 LAB — CBC WITH DIFFERENTIAL/PLATELET
Basophils Absolute: 0 10*3/uL (ref 0.0–0.2)
Basos: 0 %
EOS (ABSOLUTE): 0.1 10*3/uL (ref 0.0–0.4)
Eos: 2 %
Hematocrit: 39.1 % (ref 34.0–46.6)
Hemoglobin: 13 g/dL (ref 11.1–15.9)
Immature Grans (Abs): 0 10*3/uL (ref 0.0–0.1)
Immature Granulocytes: 0 %
Lymphocytes Absolute: 2 10*3/uL (ref 0.7–3.1)
Lymphs: 51 %
MCH: 28.9 pg (ref 26.6–33.0)
MCHC: 33.2 g/dL (ref 31.5–35.7)
MCV: 87 fL (ref 79–97)
Monocytes Absolute: 0.4 10*3/uL (ref 0.1–0.9)
Monocytes: 9 %
Neutrophils Absolute: 1.5 10*3/uL (ref 1.4–7.0)
Neutrophils: 38 %
Platelets: 224 10*3/uL (ref 150–450)
RBC: 4.5 x10E6/uL (ref 3.77–5.28)
RDW: 12.1 % (ref 11.7–15.4)
WBC: 4 10*3/uL (ref 3.4–10.8)

## 2021-11-14 LAB — TSH: TSH: 1.17 u[IU]/mL (ref 0.450–4.500)

## 2021-11-14 LAB — COMPREHENSIVE METABOLIC PANEL
ALT: 15 IU/L (ref 0–32)
AST: 14 IU/L (ref 0–40)
Albumin/Globulin Ratio: 1.8 (ref 1.2–2.2)
Albumin: 4.4 g/dL (ref 4.0–5.0)
Alkaline Phosphatase: 58 IU/L (ref 44–121)
BUN/Creatinine Ratio: 17 (ref 9–23)
BUN: 11 mg/dL (ref 6–20)
Bilirubin Total: 0.2 mg/dL (ref 0.0–1.2)
CO2: 23 mmol/L (ref 20–29)
Calcium: 9 mg/dL (ref 8.7–10.2)
Chloride: 104 mmol/L (ref 96–106)
Creatinine, Ser: 0.66 mg/dL (ref 0.57–1.00)
Globulin, Total: 2.4 g/dL (ref 1.5–4.5)
Glucose: 87 mg/dL (ref 70–99)
Potassium: 4.2 mmol/L (ref 3.5–5.2)
Sodium: 139 mmol/L (ref 134–144)
Total Protein: 6.8 g/dL (ref 6.0–8.5)
eGFR: 122 mL/min/{1.73_m2} (ref >59)

## 2021-11-14 LAB — HEMOGLOBIN A1C
Est. average glucose Bld gHb Est-mCnc: 100 mg/dL
Hgb A1c MFr Bld: 5.1 % (ref 4.8–5.6)

## 2021-11-14 LAB — LIPID PANEL
Chol/HDL Ratio: 3.1 ratio (ref 0.0–4.4)
Cholesterol, Total: 169 mg/dL (ref 100–199)
HDL: 54 mg/dL (ref >39)
LDL Chol Calc (NIH): 102 mg/dL — ABNORMAL HIGH (ref 0–99)
Triglycerides: 68 mg/dL (ref 0–149)
VLDL Cholesterol Cal: 13 mg/dL (ref 5–40)

## 2021-11-14 LAB — VITAMIN B12: Vitamin B-12: 521 pg/mL (ref 232–1245)

## 2021-11-14 LAB — VITAMIN D 25 HYDROXY (VIT D DEFICIENCY, FRACTURES): Vit D, 25-Hydroxy: 24.8 ng/mL — ABNORMAL LOW (ref 30.0–100.0)

## 2022-01-26 ENCOUNTER — Encounter: Payer: Self-pay | Admitting: Physician Assistant

## 2022-03-05 DIAGNOSIS — Z419 Encounter for procedure for purposes other than remedying health state, unspecified: Secondary | ICD-10-CM | POA: Diagnosis not present

## 2022-03-10 ENCOUNTER — Encounter: Payer: Self-pay | Admitting: Family Medicine

## 2022-03-10 ENCOUNTER — Ambulatory Visit (INDEPENDENT_AMBULATORY_CARE_PROVIDER_SITE_OTHER): Payer: Medicaid Other | Admitting: Family Medicine

## 2022-03-10 VITALS — BP 124/80 | HR 75 | Temp 98.1°F | Ht 64.0 in | Wt 188.2 lb

## 2022-03-10 DIAGNOSIS — Z1159 Encounter for screening for other viral diseases: Secondary | ICD-10-CM

## 2022-03-10 DIAGNOSIS — L659 Nonscarring hair loss, unspecified: Secondary | ICD-10-CM

## 2022-03-10 DIAGNOSIS — R5383 Other fatigue: Secondary | ICD-10-CM

## 2022-03-10 DIAGNOSIS — L700 Acne vulgaris: Secondary | ICD-10-CM

## 2022-03-10 DIAGNOSIS — L680 Hirsutism: Secondary | ICD-10-CM | POA: Diagnosis not present

## 2022-03-10 LAB — TSH: TSH: 1.39 u[IU]/mL (ref 0.35–5.50)

## 2022-03-10 LAB — IBC + FERRITIN
Ferritin: 39 ng/mL (ref 10.0–291.0)
Iron: 89 ug/dL (ref 42–145)
Saturation Ratios: 24.9 % (ref 20.0–50.0)
TIBC: 357 ug/dL (ref 250.0–450.0)
Transferrin: 255 mg/dL (ref 212.0–360.0)

## 2022-03-10 LAB — FOLLICLE STIMULATING HORMONE: FSH: 6.4 m[IU]/mL

## 2022-03-10 LAB — LUTEINIZING HORMONE: LH: 24.62 m[IU]/mL

## 2022-03-10 MED ORDER — TRETINOIN 0.01 % EX GEL: Freq: Every day | CUTANEOUS | 1 refills | Status: DC

## 2022-03-10 NOTE — Patient Instructions (Signed)
Welcome to [facility] at [facility]! It was a pleasure meeting you today.  As discussed, Please schedule a 6 month follow up visit today.  PLEASE NOTE:  If you had any LAB tests please let us know if you have not heard back within a few days. You may see your results on MyChart before we have a chance to review them but we will give you a call once they are reviewed by us. If we ordered any REFERRALS today, please let us know if you have not heard from their office within the next week.  Let us know through MyChart if you are needing REFILLS, or have your pharmacy send us the request. You can also use MyChart to communicate with me or any office staff.  Please try these tips to maintain a healthy lifestyle:  Eat most of your calories during the day when you are active. Eliminate processed foods including packaged sweets (pies, cakes, cookies), reduce intake of potatoes, white bread, white pasta, and white rice. Look for whole grain options, oat flour or almond flour.  Each meal should contain half fruits/vegetables, one quarter protein, and one quarter carbs (no bigger than a computer mouse).  Cut down on sweet beverages. This includes juice, soda, and sweet tea. Also watch fruit intake, though this is a healthier sweet option, it still contains natural sugar! Limit to 3 servings daily.  Drink at least 1 glass of water with each meal and aim for at least 8 glasses per day  Exercise at least 150 minutes every week.   

## 2022-03-10 NOTE — Progress Notes (Signed)
New Patient Office Visit  Subjective:  Patient ID: Jessica Barton, female    DOB: Mar 20, 1991  Age: 31 y.o. MRN: [id number]  CC:  Chief Complaint  Patient presents with   Establish Care    Need new pcp Not fasting     HPI Jessica Barton presents for new pt. Fatigue  New pt.  Acne a lot and hair.-face and back and hyperpigmentation.  Some scarring. Hair face/chin/neck.  Req referral to derm.  ?PCOS-spots between periods. IUD for about 1 yr-paragard. .  No diff getting pregnant.  1st one took 1 yr, but then the other 2 ok. Menses were regular in past about 28 days, now about 35 days.     LMP 11/22 and spotted about 2 wks later.  Losing hair on head. Fatigue-has 3 children.  Reviewed labs from August-normal.  A little depressed. No SI.  Stressed/overwhelmed.  Not needing tx.    Past Medical History:  Diagnosis Date   Glaucoma (increased eye pressure)    Normal pregnancy in multigravida in third trimester 09/20/2015   Pregnancy induced hypertension    Preterm delivery 09/20/2015    Past Surgical History:  Procedure Laterality Date   EYE SURGERY Left    mult surg   NOSE SURGERY      Family History  Problem Relation Age of Onset   Diabetes Father    Hypertension Father    Hyperlipidemia Father     Social History   Socioeconomic History   Marital status: Married    Spouse name: Not on file   Number of children: 3   Years of education: Not on file   Highest education level: Not on file  Occupational History   Not on file  Tobacco Use   Smoking status: Never   Smokeless tobacco: Never  Vaping Use   Vaping Use: Never used  Substance and Sexual Activity   Alcohol use: No   Drug use: No   Sexual activity: Yes    Birth control/protection: None  Other Topics Concern   Not on file  Social History Narrative   mom   Social Determinants of Health   Financial Resource Strain: Not on file  Food Insecurity: Not on file  Transportation Needs: Not on file  Physical  Activity: Not on file  Stress: Not on file  Social Connections: Not on file  Intimate Partner Violence: Not on file    ROS  ROS: Gen: no fever, chills  Skin: no rash, itching ENT: no ear pain, ear drainage, nasal congestion, rhinorrhea, sinus pressure, sore throat Eyes: seeing opt Resp: no cough, wheeze,SOB CV: no CP, palpitations, LE edema,  GI: no heartburn, n/v/d/c, abd pain GU: no dysuria, urgency, frequency, hematuria MSK: no joint pain, myalgias, back pain Neuro: no dizziness, headache, weakness, vertigo Psych: no depression, anxiety, insomnia, SI   Objective:   Today's Vitals: BP 124/80   Pulse 75   Temp 98.1 F (36.7 C) (Temporal)   Ht 5\' 4"  (1.626 m)   Wt 188 lb 4 oz (85.4 kg)   LMP 02/24/2022 (Exact Date)   SpO2 96%   BMI 32.31 kg/m   Physical Exam  Gen: WDWN NAD HEENT: NCAT, conjunctiva not injected, sclera nonicteric NECK:  supple, no thyromegaly, no nodes, no carotid bruits CARDIAC: RRR, S1S2+, no murmur. DP 2+B LUNGS: CTAB. No wheezes ABDOMEN:  BS+, soft, NTND, No HSM, no masses EXT:  no edema MSK: no gross abnormalities.  NEURO: A&O x3.  CN II-XII intact.  PSYCH: normal mood. Good eye contact  Increased facial hair.  Not on abd. Some on back.  No thinning scalp.  Hair pull negative.  Some mild acne.  Assessment & Plan:   Problem List Items Addressed This Visit   None Visit Diagnoses     Fatigue, unspecified type    -  Primary   Acne vulgaris       Hair loss       Hirsutism          Fatigue-labs in Aug unrevealing.  Could be lifestyle.  Monitor. Hair loss-may be from stress, ethnicity, anesthesia, other.  Will check iron studies, tsh, testosterone Hirsuitism-more face and back-prob ethnicity.  Check testosteron, fsh,lh, refer derm Acne-retin a 0.01 %.  Refer derm  Poss pcos-pt to f/u gyn.   F/u annual 6 mo  Outpatient Encounter Medications as of 03/10/2022  Medication Sig   Cholecalciferol (D3 PO) Take by mouth daily.   Dorzolamide  HCl-Timolol Mal PF 2-0.5 % SOLN Apply to eye.   methylPREDNISolone (MEDROL DOSEPAK) 4 MG TBPK tablet Take by mouth as directed.   paragard intrauterine copper IUD IUD [facility]   prednisoLONE acetate (PRED FORTE) 1 % ophthalmic suspension INSTILL 1 DROP INTO THE LEFT EYE EVERY HOUR WHILE AWAKE STARTING AFTER SURGERY   [DISCONTINUED] acetaminophen (TYLENOL) 325 MG tablet Take 2 tablets (650 mg total) by mouth every 4 (four) hours as needed (for pain scale < 4).   [DISCONTINUED] ibuprofen (ADVIL) 600 MG tablet Take 1 tablet (600 mg total) by mouth every 6 (six) hours.   No facility-administered encounter medications on file as of 03/10/2022.    Follow-up: No follow-ups on file.   Angelena Sole, MD

## 2022-03-17 ENCOUNTER — Encounter: Payer: Self-pay | Admitting: Family Medicine

## 2022-03-22 DIAGNOSIS — Z961 Presence of intraocular lens: Secondary | ICD-10-CM | POA: Diagnosis not present

## 2022-03-22 DIAGNOSIS — Z947 Corneal transplant status: Secondary | ICD-10-CM | POA: Diagnosis not present

## 2022-03-22 DIAGNOSIS — H1812 Bullous keratopathy, left eye: Secondary | ICD-10-CM | POA: Diagnosis not present

## 2022-03-22 DIAGNOSIS — H18232 Secondary corneal edema, left eye: Secondary | ICD-10-CM | POA: Diagnosis not present

## 2022-03-22 DIAGNOSIS — Z9889 Other specified postprocedural states: Secondary | ICD-10-CM | POA: Diagnosis not present

## 2022-03-22 DIAGNOSIS — H1820 Unspecified corneal edema: Secondary | ICD-10-CM | POA: Diagnosis not present

## 2022-03-24 ENCOUNTER — Other Ambulatory Visit: Payer: Medicaid Other

## 2022-04-01 ENCOUNTER — Other Ambulatory Visit: Payer: Medicaid Other

## 2022-04-03 DIAGNOSIS — H6691 Otitis media, unspecified, right ear: Secondary | ICD-10-CM | POA: Diagnosis not present

## 2022-04-05 DIAGNOSIS — Z419 Encounter for procedure for purposes other than remedying health state, unspecified: Secondary | ICD-10-CM | POA: Diagnosis not present

## 2022-04-08 ENCOUNTER — Other Ambulatory Visit: Payer: Self-pay

## 2022-04-08 DIAGNOSIS — L700 Acne vulgaris: Secondary | ICD-10-CM

## 2022-04-08 DIAGNOSIS — L659 Nonscarring hair loss, unspecified: Secondary | ICD-10-CM

## 2022-04-08 DIAGNOSIS — L680 Hirsutism: Secondary | ICD-10-CM

## 2022-04-09 ENCOUNTER — Other Ambulatory Visit: Payer: Medicaid Other

## 2022-04-09 ENCOUNTER — Other Ambulatory Visit (INDEPENDENT_AMBULATORY_CARE_PROVIDER_SITE_OTHER): Payer: Medicaid Other

## 2022-04-09 DIAGNOSIS — L659 Nonscarring hair loss, unspecified: Secondary | ICD-10-CM | POA: Diagnosis not present

## 2022-04-09 DIAGNOSIS — L680 Hirsutism: Secondary | ICD-10-CM

## 2022-04-09 DIAGNOSIS — L700 Acne vulgaris: Secondary | ICD-10-CM | POA: Diagnosis not present

## 2022-04-09 LAB — TESTOSTERONE: Testosterone: 43.31 ng/dL — ABNORMAL HIGH (ref 15.00–40.00)

## 2022-04-30 DIAGNOSIS — Z113 Encounter for screening for infections with a predominantly sexual mode of transmission: Secondary | ICD-10-CM | POA: Diagnosis not present

## 2022-04-30 DIAGNOSIS — Z3202 Encounter for pregnancy test, result negative: Secondary | ICD-10-CM | POA: Diagnosis not present

## 2022-04-30 DIAGNOSIS — N926 Irregular menstruation, unspecified: Secondary | ICD-10-CM | POA: Diagnosis not present

## 2022-05-06 DIAGNOSIS — Z419 Encounter for procedure for purposes other than remedying health state, unspecified: Secondary | ICD-10-CM | POA: Diagnosis not present

## 2022-05-10 DIAGNOSIS — N926 Irregular menstruation, unspecified: Secondary | ICD-10-CM | POA: Diagnosis not present

## 2022-05-10 DIAGNOSIS — N939 Abnormal uterine and vaginal bleeding, unspecified: Secondary | ICD-10-CM | POA: Diagnosis not present

## 2022-05-25 DIAGNOSIS — H18232 Secondary corneal edema, left eye: Secondary | ICD-10-CM | POA: Diagnosis not present

## 2022-05-25 DIAGNOSIS — T868412 Corneal transplant failure, left eye: Secondary | ICD-10-CM | POA: Diagnosis not present

## 2022-06-04 DIAGNOSIS — Z419 Encounter for procedure for purposes other than remedying health state, unspecified: Secondary | ICD-10-CM | POA: Diagnosis not present

## 2022-07-05 DIAGNOSIS — Z419 Encounter for procedure for purposes other than remedying health state, unspecified: Secondary | ICD-10-CM | POA: Diagnosis not present

## 2022-07-06 ENCOUNTER — Encounter: Payer: Self-pay | Admitting: Family Medicine

## 2022-07-21 DIAGNOSIS — H1812 Bullous keratopathy, left eye: Secondary | ICD-10-CM | POA: Diagnosis not present

## 2022-07-21 DIAGNOSIS — H18232 Secondary corneal edema, left eye: Secondary | ICD-10-CM | POA: Diagnosis not present

## 2022-07-27 DIAGNOSIS — H4052X2 Glaucoma secondary to other eye disorders, left eye, moderate stage: Secondary | ICD-10-CM | POA: Diagnosis not present

## 2022-08-04 DIAGNOSIS — Z419 Encounter for procedure for purposes other than remedying health state, unspecified: Secondary | ICD-10-CM | POA: Diagnosis not present

## 2022-08-05 ENCOUNTER — Telehealth: Payer: Self-pay

## 2022-08-05 NOTE — Telephone Encounter (Signed)
PA request received via CMM for Tretinoin 0.01% gel  PA has been submitted to [facility] Medicaid [place] and is pending determination  Key: [id number]

## 2022-08-09 ENCOUNTER — Other Ambulatory Visit (HOSPITAL_COMMUNITY): Payer: Self-pay

## 2022-08-09 NOTE — Telephone Encounter (Signed)
Pharmacy Patient Advocate Encounter  Prior Authorization for [facility]) has been approved by [facility] (ins).    PA # [id number] Effective dates: 08/05/2022 through 04/05/2023  Spoke with Pharmacy to process. Copay is $4.00  Georga Bora Rx Patient Advocate [phone]

## 2022-09-04 DIAGNOSIS — Z419 Encounter for procedure for purposes other than remedying health state, unspecified: Secondary | ICD-10-CM | POA: Diagnosis not present

## 2022-09-14 ENCOUNTER — Encounter: Payer: Medicaid Other | Admitting: Family Medicine

## 2022-10-04 DIAGNOSIS — Z419 Encounter for procedure for purposes other than remedying health state, unspecified: Secondary | ICD-10-CM | POA: Diagnosis not present

## 2022-10-06 DIAGNOSIS — Z947 Corneal transplant status: Secondary | ICD-10-CM | POA: Diagnosis not present

## 2022-10-12 ENCOUNTER — Encounter: Payer: Medicaid Other | Admitting: Family Medicine

## 2022-11-04 DIAGNOSIS — Z419 Encounter for procedure for purposes other than remedying health state, unspecified: Secondary | ICD-10-CM | POA: Diagnosis not present

## 2022-11-09 DIAGNOSIS — H18232 Secondary corneal edema, left eye: Secondary | ICD-10-CM | POA: Diagnosis not present

## 2022-11-09 DIAGNOSIS — H4052X2 Glaucoma secondary to other eye disorders, left eye, moderate stage: Secondary | ICD-10-CM | POA: Diagnosis not present

## 2022-11-29 DIAGNOSIS — H018 Other specified inflammations of eyelid: Secondary | ICD-10-CM | POA: Diagnosis not present

## 2022-12-05 DIAGNOSIS — Z419 Encounter for procedure for purposes other than remedying health state, unspecified: Secondary | ICD-10-CM | POA: Diagnosis not present

## 2023-01-04 DIAGNOSIS — Z419 Encounter for procedure for purposes other than remedying health state, unspecified: Secondary | ICD-10-CM | POA: Diagnosis not present

## 2023-01-13 NOTE — Progress Notes (Signed)
Phone [phone]   Subjective:   Patient is a 32 y.o. female presenting for annual physical.    Chief Complaint  Patient presents with   Annual Exam    CPE Fasting Will get pap at gyn   Annual - Exercising 3 days a week at the gym and sometimes walks around her house. Has 3 kids, ages 42, 32, and 2.  Hair loss - Continues to experience hair loss. Was previously referred to dermatology, reports being told they were only seeing patients for skin related visits at the time.   Paragard IUD - Still has IUD in place. Previously was having irregular periods, but currently is having regular periods.   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 States vision is the same as before.   Last vitamin D was 24.8 from 11/13/2021. Pt is taking vitamin D OTC.    See problem oriented charting- ROS- ROS: Gen: no fever, chills  Skin: no rash, itching ENT: no ear pain, ear drainage, nasal congestion, rhinorrhea, sinus pressure, sore throat Eyes: no blurry vision, double vision Resp: no cough, wheeze,SOB CV: no CP, palpitations, LE edema,  GI: no heartburn, n/v/d/c, abd pain GU: no dysuria, urgency, frequency, hematuria MSK: no joint pain, myalgias, back pain Neuro: no dizziness, headache, weakness, vertigo Psych: no depression, anxiety, insomnia, SI   The following were reviewed and entered/updated in epic: Past Medical History:  Diagnosis Date   Glaucoma (increased eye pressure)    Normal pregnancy in multigravida in third trimester 09/20/2015   Pregnancy induced hypertension    Preterm delivery 09/20/2015   Patient Active Problem List   Diagnosis Date Noted   Amenorrhea 11/03/2021   Anovulation 11/03/2021   Corneal edema 11/03/2021   Glaucoma 11/03/2021   High risk pregnancy due to history of preterm labor 11/03/2021   Morning sickness 11/03/2021   Oligomenorrhea 11/03/2021   Pregnancy with mental disorders 11/03/2021   Pseudophakia of  left eye 11/03/2021   IUGR (intrauterine growth restriction) affecting care of mother, third trimester, fetus 2 10/19/2020   Viral upper respiratory tract infection 02/28/2018   Normal pregnancy in multigravida in third trimester 09/20/2015   Preterm delivery 09/20/2015   Normal labor 09/19/2015   Other specified indication for care or intervention related to labor and delivery, unspecified as to episode of care 10/19/2013   Third degree laceration of perineum during delivery, postpartum 10/19/2013   Supervision of normal pregnancy 08/09/2013   Uterine size-date discrepancy, antepartum 07/19/2013   Past Surgical History:  Procedure Laterality Date   EYE SURGERY Left    mult surg   NOSE SURGERY      Family History  Problem Relation Age of Onset   Diabetes Father    Hypertension Father    Hyperlipidemia Father     Medications- reviewed and updated Current Outpatient Medications  Medication Sig Dispense Refill   Cholecalciferol (D3 PO) Take by mouth daily.     dorzolamide-timolol (COSOPT) 2-0.5 % ophthalmic solution Apply to eye.     Flax Oil-Fish Oil-Borage Oil (FISH OIL-FLAX OIL-BORAGE OIL PO) Take by mouth.     Magnesium Bisglycinate (MAG GLYCINATE) 100 MG TABS Take by mouth.     paragard intrauterine copper IUD IUD 1 device [facility]     prednisoLONE acetate (PRED FORTE) 1 % ophthalmic suspension INSTILL 1 DROP INTO THE LEFT EYE EVERY HOUR WHILE AWAKE STARTING AFTER SURGERY     prednisoLONE acetate (PRED FORTE) 1 % ophthalmic  suspension Apply to eye.     No current facility-administered medications for this visit.    Allergies-reviewed and updated Allergies  Allergen Reactions   Penicillins Rash    Has patient had a PCN reaction causing immediate rash, facial/tongue/throat swelling, SOB or lightheadedness with hypotension: no Has patient had a PCN reaction causing severe rash involving mucus membranes or skin necrosis: no Has patient had a PCN reaction that  required hospitalization no Has patient had a PCN reaction occurring within the last 10 years: No If all of the above answers are "NO", then may proceed with Cephalosporin use.     Social History   Social History Narrative   mom   Objective  Objective:  BP 119/80   Pulse 73   Temp 98.2 F (36.8 C) (Temporal)   Resp 18   Ht 5\' 4"  (1.626 m)   Wt 175 lb 4 oz (79.5 kg)   LMP 01/08/2023 (Exact Date)   SpO2 99%   Breastfeeding No   BMI 30.08 kg/m  Physical Exam  Gen: WDWN NAD HEENT: NCAT, conjunctiva not injected, sclera nonicteric TM WNL B, OP moist, no exudates  NECK:  supple, no thyromegaly, no nodes, no carotid bruits CARDIAC: RRR, S1S2+, no murmur. DP 2+B LUNGS: CTAB. No wheezes ABDOMEN:  BS+, soft, NTND, No HSM, no masses EXT:  no edema MSK: no gross abnormalities. MS 5/5 all 4 NEURO: A&O x3.  CN II-XII intact.  PSYCH: normal mood. Good eye contact   +Slight tenderness, upper abdomen   Assessment and Plan   Health Maintenance counseling: 1. Anticipatory guidance: Patient counseled regarding regular dental exams q6 months, eye exams,  avoiding smoking and second hand smoke, limiting alcohol to 1 beverage per day, no illicit drugs.   2. Risk factor reduction:  Advised patient of need for regular exercise and diet rich and fruits and vegetables to reduce risk of heart attack and stroke. Exercise- 3 days a week, sometimes walking.  Wt Readings from Last 3 Encounters:  01/14/23 175 lb 4 oz (79.5 kg)  03/10/22 188 lb 4 oz (85.4 kg)  11/03/21 191 lb (86.6 kg)   3. Immunizations/screenings/ancillary studies Immunization History  Administered Date(s) Administered   DTP 05/21/1993, 07/21/1993, 09/24/1993, 06/07/1994   DTaP 06/25/1997   HIB (PRP-T) 07/21/1993, 09/24/1993   HIB, Unspecified 05/21/1993   Hep B, Unspecified 09-16-92, 05/21/1993, 09/24/1993   Hepatitis A, Ped/Adol-2 Dose 08/06/2009, 08/24/2010   Influenza Nasal 02/12/2009   MMR 06/07/1994, 07/22/1997    Meningococcal Conjugate 08/06/2009   OPV 05/21/1993, 07/21/1993, 09/24/1993, 06/25/1997   PFIZER(Purple Top)SARS-COV-2 Vaccination 08/02/2019, 08/23/2019   Tdap 08/06/2009, 09/22/2015, 08/11/2020   Varicella 07/22/1997, 08/06/2009   Health Maintenance Due  Topic Date Due   Hepatitis C Screening  Never done   Cervical Cancer Screening (Pap smear)  Never done    4. Cervical cancer screening: getting pap at gyn 5. Skin cancer screening- advised regular sunscreen use. Denies worrisome, changing, or new skin lesions.  6. Birth control/STD check: paragard IUD in place 7. Smoking associated screening: non smoker 8. Alcohol screening: n/a  Wellness examination -     CBC with Differential/Platelet -     Comprehensive metabolic panel -     Lipid panel -     TSH -     Hemoglobin A1c -     Hepatitis C antibody  Screening for viral disease -     Hepatitis C antibody  Hair loss -     Ambulatory referral to Dermatology  Wellness-anticipatory guidance.  Work on Diet/Exercise  Check CBC,CMP,lipids,TSH, A1C.  F/u 1 yr   Recommended follow up: Return in about 1 year (around 01/14/2024) for annual physical.  Lab/Order associations: +fasting   I,Rachel Rivera,acting as a scribe for Angelena Sole, MD.,have documented all relevant documentation on the behalf of Angelena Sole, MD,as directed by  Angelena Sole, MD while in the presence of Angelena Sole, MD.  I, Angelena Sole, MD, have reviewed all documentation for this visit. The documentation on 01/14/23 for the exam, diagnosis, procedures, and orders are all accurate and complete.    Angelena Sole, MD

## 2023-01-14 ENCOUNTER — Encounter: Payer: Self-pay | Admitting: Family Medicine

## 2023-01-14 ENCOUNTER — Ambulatory Visit (INDEPENDENT_AMBULATORY_CARE_PROVIDER_SITE_OTHER): Payer: Medicaid Other | Admitting: Family Medicine

## 2023-01-14 VITALS — BP 119/80 | HR 73 | Temp 98.2°F | Resp 18 | Ht 64.0 in | Wt 175.2 lb

## 2023-01-14 DIAGNOSIS — L659 Nonscarring hair loss, unspecified: Secondary | ICD-10-CM | POA: Diagnosis not present

## 2023-01-14 DIAGNOSIS — Z0000 Encounter for general adult medical examination without abnormal findings: Secondary | ICD-10-CM

## 2023-01-14 DIAGNOSIS — Z1159 Encounter for screening for other viral diseases: Secondary | ICD-10-CM | POA: Diagnosis not present

## 2023-01-14 NOTE — Patient Instructions (Signed)

## 2023-01-26 DIAGNOSIS — H4052X2 Glaucoma secondary to other eye disorders, left eye, moderate stage: Secondary | ICD-10-CM | POA: Diagnosis not present

## 2023-01-26 DIAGNOSIS — H018 Other specified inflammations of eyelid: Secondary | ICD-10-CM | POA: Diagnosis not present

## 2023-02-01 DIAGNOSIS — H4052X2 Glaucoma secondary to other eye disorders, left eye, moderate stage: Secondary | ICD-10-CM | POA: Diagnosis not present

## 2023-02-01 DIAGNOSIS — Z947 Corneal transplant status: Secondary | ICD-10-CM | POA: Diagnosis not present

## 2023-02-04 DIAGNOSIS — Z419 Encounter for procedure for purposes other than remedying health state, unspecified: Secondary | ICD-10-CM | POA: Diagnosis not present

## 2023-02-08 DIAGNOSIS — H4052X2 Glaucoma secondary to other eye disorders, left eye, moderate stage: Secondary | ICD-10-CM | POA: Diagnosis not present

## 2023-02-10 DIAGNOSIS — T868412 Corneal transplant failure, left eye: Secondary | ICD-10-CM | POA: Diagnosis not present

## 2023-02-18 DIAGNOSIS — Z947 Corneal transplant status: Secondary | ICD-10-CM | POA: Diagnosis not present

## 2023-03-06 DIAGNOSIS — Z419 Encounter for procedure for purposes other than remedying health state, unspecified: Secondary | ICD-10-CM | POA: Diagnosis not present

## 2023-03-16 DIAGNOSIS — Z947 Corneal transplant status: Secondary | ICD-10-CM | POA: Diagnosis not present

## 2023-03-16 DIAGNOSIS — T868412 Corneal transplant failure, left eye: Secondary | ICD-10-CM | POA: Diagnosis not present

## 2023-03-16 DIAGNOSIS — H4052X2 Glaucoma secondary to other eye disorders, left eye, moderate stage: Secondary | ICD-10-CM | POA: Diagnosis not present

## 2023-03-22 DIAGNOSIS — N644 Mastodynia: Secondary | ICD-10-CM | POA: Diagnosis not present

## 2023-03-22 DIAGNOSIS — N61 Mastitis without abscess: Secondary | ICD-10-CM | POA: Diagnosis not present

## 2023-03-31 ENCOUNTER — Encounter: Payer: Self-pay | Admitting: Family Medicine

## 2023-03-31 DIAGNOSIS — Z124 Encounter for screening for malignant neoplasm of cervix: Secondary | ICD-10-CM | POA: Diagnosis not present

## 2023-03-31 DIAGNOSIS — Z1151 Encounter for screening for human papillomavirus (HPV): Secondary | ICD-10-CM | POA: Diagnosis not present

## 2023-03-31 LAB — RESULTS CONSOLE HPV: CHL HPV: NEGATIVE

## 2023-03-31 LAB — HM PAP SMEAR

## 2023-04-06 DIAGNOSIS — Z419 Encounter for procedure for purposes other than remedying health state, unspecified: Secondary | ICD-10-CM | POA: Diagnosis not present

## 2023-04-12 DIAGNOSIS — H4052X2 Glaucoma secondary to other eye disorders, left eye, moderate stage: Secondary | ICD-10-CM | POA: Diagnosis not present

## 2023-04-12 DIAGNOSIS — H1812 Bullous keratopathy, left eye: Secondary | ICD-10-CM | POA: Diagnosis not present

## 2023-05-07 DIAGNOSIS — Z419 Encounter for procedure for purposes other than remedying health state, unspecified: Secondary | ICD-10-CM | POA: Diagnosis not present

## 2023-05-12 DIAGNOSIS — T8529XA Other mechanical complication of intraocular lens, initial encounter: Secondary | ICD-10-CM | POA: Diagnosis not present

## 2023-05-12 DIAGNOSIS — T868412 Corneal transplant failure, left eye: Secondary | ICD-10-CM | POA: Diagnosis not present

## 2023-05-12 DIAGNOSIS — Z880 Allergy status to penicillin: Secondary | ICD-10-CM | POA: Diagnosis not present

## 2023-05-12 DIAGNOSIS — Z888 Allergy status to other drugs, medicaments and biological substances status: Secondary | ICD-10-CM | POA: Diagnosis not present

## 2023-05-19 DIAGNOSIS — J029 Acute pharyngitis, unspecified: Secondary | ICD-10-CM | POA: Diagnosis not present

## 2023-05-19 DIAGNOSIS — R051 Acute cough: Secondary | ICD-10-CM | POA: Diagnosis not present

## 2023-05-19 DIAGNOSIS — R509 Fever, unspecified: Secondary | ICD-10-CM | POA: Diagnosis not present

## 2023-05-24 DIAGNOSIS — Z9889 Other specified postprocedural states: Secondary | ICD-10-CM | POA: Diagnosis not present

## 2023-05-27 ENCOUNTER — Other Ambulatory Visit: Payer: Self-pay | Admitting: Family Medicine

## 2023-05-27 DIAGNOSIS — L700 Acne vulgaris: Secondary | ICD-10-CM

## 2023-06-01 ENCOUNTER — Other Ambulatory Visit (HOSPITAL_COMMUNITY): Payer: Self-pay

## 2023-06-01 ENCOUNTER — Telehealth: Payer: Self-pay | Admitting: Pharmacy Technician

## 2023-06-01 NOTE — Telephone Encounter (Signed)
 Pharmacy Patient Advocate Encounter   Received notification from CoverMyMeds that prior authorization for TRETINOIN 0.01% GEL is required/requested.   Insurance verification completed.   The patient is insured through [facility] [employer] [place] .   Per test claim:  BRAND NAME RETIN-A 0.01% GEL is preferred by the insurance.  If suggested medication is appropriate, Please send in a new RX and discontinue this one. If not, please advise as to why it's not appropriate so that we may request a Prior Authorization. Please note, some preferred medications may still require a PA.  If the suggested medications have not been trialed and there are no contraindications to their use, the PA will not be submitted, as it will not be approved.  Called patients pharmacy and advised pharmacists to fill for Brand name as preferred by the insurance.

## 2023-06-04 DIAGNOSIS — Z419 Encounter for procedure for purposes other than remedying health state, unspecified: Secondary | ICD-10-CM | POA: Diagnosis not present

## 2023-06-07 DIAGNOSIS — H4052X2 Glaucoma secondary to other eye disorders, left eye, moderate stage: Secondary | ICD-10-CM | POA: Diagnosis not present

## 2023-06-14 DIAGNOSIS — H4052X2 Glaucoma secondary to other eye disorders, left eye, moderate stage: Secondary | ICD-10-CM | POA: Diagnosis not present

## 2023-06-29 DIAGNOSIS — H1812 Bullous keratopathy, left eye: Secondary | ICD-10-CM | POA: Diagnosis not present

## 2023-06-29 DIAGNOSIS — Z947 Corneal transplant status: Secondary | ICD-10-CM | POA: Diagnosis not present

## 2023-06-29 DIAGNOSIS — T868412 Corneal transplant failure, left eye: Secondary | ICD-10-CM | POA: Diagnosis not present

## 2023-07-16 DIAGNOSIS — Z419 Encounter for procedure for purposes other than remedying health state, unspecified: Secondary | ICD-10-CM | POA: Diagnosis not present

## 2023-08-15 ENCOUNTER — Encounter: Payer: Self-pay | Admitting: Family Medicine

## 2023-08-15 ENCOUNTER — Ambulatory Visit (INDEPENDENT_AMBULATORY_CARE_PROVIDER_SITE_OTHER): Admitting: Family Medicine

## 2023-08-15 VITALS — BP 122/82 | HR 99 | Temp 97.9°F | Ht 64.0 in | Wt 166.8 lb

## 2023-08-15 DIAGNOSIS — R5383 Other fatigue: Secondary | ICD-10-CM | POA: Diagnosis not present

## 2023-08-15 DIAGNOSIS — E559 Vitamin D deficiency, unspecified: Secondary | ICD-10-CM | POA: Diagnosis not present

## 2023-08-15 DIAGNOSIS — Z419 Encounter for procedure for purposes other than remedying health state, unspecified: Secondary | ICD-10-CM | POA: Diagnosis not present

## 2023-08-15 DIAGNOSIS — L659 Nonscarring hair loss, unspecified: Secondary | ICD-10-CM | POA: Diagnosis not present

## 2023-08-15 LAB — IBC + FERRITIN
Ferritin: 38.6 ng/mL (ref 10.0–291.0)
Iron: 98 ug/dL (ref 42–145)
Saturation Ratios: 28.2 % (ref 20.0–50.0)
TIBC: 347.2 ug/dL (ref 250.0–450.0)
Transferrin: 248 mg/dL (ref 212.0–360.0)

## 2023-08-15 LAB — COMPREHENSIVE METABOLIC PANEL WITH GFR
ALT: 12 U/L (ref 0–35)
AST: 12 U/L (ref 0–37)
Albumin: 4.5 g/dL (ref 3.5–5.2)
Alkaline Phosphatase: 35 U/L — ABNORMAL LOW (ref 39–117)
BUN: 16 mg/dL (ref 6–23)
CO2: 26 meq/L (ref 19–32)
Calcium: 9 mg/dL (ref 8.4–10.5)
Chloride: 105 meq/L (ref 96–112)
Creatinine, Ser: 0.54 mg/dL (ref 0.40–1.20)
GFR: 123.55 mL/min (ref >60.00)
Glucose, Bld: 84 mg/dL (ref 70–99)
Potassium: 3.8 meq/L (ref 3.5–5.1)
Sodium: 138 meq/L (ref 135–145)
Total Bilirubin: 0.5 mg/dL (ref 0.2–1.2)
Total Protein: 7 g/dL (ref 6.0–8.3)

## 2023-08-15 LAB — CBC WITH DIFFERENTIAL/PLATELET
Basophils Absolute: 0 10*3/uL (ref 0.0–0.1)
Basophils Relative: 0.3 % (ref 0.0–3.0)
Eosinophils Absolute: 0.1 10*3/uL (ref 0.0–0.7)
Eosinophils Relative: 1.9 % (ref 0.0–5.0)
HCT: 40.1 % (ref 36.0–46.0)
Hemoglobin: 13.6 g/dL (ref 12.0–15.0)
Lymphocytes Relative: 46.2 % — ABNORMAL HIGH (ref 12.0–46.0)
Lymphs Abs: 1.7 10*3/uL (ref 0.7–4.0)
MCHC: 34 g/dL (ref 30.0–36.0)
MCV: 88.6 fl (ref 78.0–100.0)
Monocytes Absolute: 0.3 10*3/uL (ref 0.1–1.0)
Monocytes Relative: 9.2 % (ref 3.0–12.0)
Neutro Abs: 1.6 10*3/uL (ref 1.4–7.7)
Neutrophils Relative %: 42.4 % — ABNORMAL LOW (ref 43.0–77.0)
Platelets: 216 10*3/uL (ref 150.0–400.0)
RBC: 4.52 Mil/uL (ref 3.87–5.11)
RDW: 13 % (ref 11.5–15.5)
WBC: 3.8 10*3/uL — ABNORMAL LOW (ref 4.0–10.5)

## 2023-08-15 LAB — HEMOGLOBIN A1C: Hgb A1c MFr Bld: 5 % (ref 4.6–6.5)

## 2023-08-15 LAB — VITAMIN B12: Vitamin B-12: 258 pg/mL (ref 211–911)

## 2023-08-15 LAB — TSH: TSH: 1.74 u[IU]/mL (ref 0.35–5.50)

## 2023-08-15 LAB — TESTOSTERONE: Testosterone: 16.3 ng/dL (ref 15.00–40.00)

## 2023-08-15 LAB — VITAMIN D 25 HYDROXY (VIT D DEFICIENCY, FRACTURES): VITD: 18.31 ng/mL — ABNORMAL LOW (ref 30.00–100.00)

## 2023-08-15 NOTE — Progress Notes (Signed)
 Subjective:     Patient ID: Jessica Barton, female    DOB: [birth year], 33 y.o.   MRN: [id number]  Chief Complaint  Patient presents with   Medical Management of Chronic Issues    Pt in office requesting labs to check for any deficiencies causing low energy and hair loss; hair loss is ongoing issue;      HPI Discussed the use of AI scribe software for clinical note transcription with the patient, who gave verbal consent to proceed.  History of Present Illness Jessica Barton is a 33 year old female who presents with hair loss and low energy.  She has been experiencing hair loss characterized by thinning rather than patchy baldness over the past two years. There is a significant reduction in hair volume, estimating she had 'four times this much' hair two years ago. She used  vitamins but has not noticed improvement.  She reports persistent low energy and fatigue, describing herself as 'tired all the time' and struggling to perform daily activities, which is challenging as a mother. She has a history of low vitamin D  levels, with a previous level of 24, but has not checked her levels in two years. She experiences heavy menstrual periods since getting an IUD, with periods lasting seven days and using about four pads on heavy days. She does not take vitamins with iron. No rectal bleeding or dark stools.  Her sleep is insufficient, often getting four to five hours due to late bedtimes and early wake-ups for school. She attributes her late bedtimes to needing 'me time' after her children sleep. Despite this, she maintains an active lifestyle, walking or going to the gym, achieving about fifteen thousand steps a day. No depression or suicidal thoughts.  She recalls a previous blood test showing high testosterone  levels and questions if this could relate to her symptoms. She also mentions her ferritin levels were on the lower side, though not too low, and she does not currently take iron supplements.     There are no preventive care reminders to display for this patient.  Past Medical History:  Diagnosis Date   Glaucoma (increased eye pressure)    Normal pregnancy in multigravida in third trimester 09/20/2015   Pregnancy induced hypertension    Preterm delivery 09/20/2015    Past Surgical History:  Procedure Laterality Date   EYE SURGERY Left    mult surg   NOSE SURGERY       Current Outpatient Medications:    Cholecalciferol (D3 PO), Take by mouth daily., Disp: , Rfl:    dorzolamide -timolol  (COSOPT ) 2-0.5 % ophthalmic solution, Apply to eye., Disp: , Rfl:    latanoprost (XALATAN) 0.005 % ophthalmic solution, Place 1 drop into the left eye at bedtime., Disp: , Rfl:    Magnesium  Bisglycinate (MAG GLYCINATE) 100 MG TABS, Take by mouth., Disp: , Rfl:    paragard  intrauterine copper  IUD IUD, [facility], Disp: , Rfl:    prednisoLONE acetate (PRED FORTE) 1 % ophthalmic suspension, Place 1 drop into the left eye 4 (four) times daily., Disp: , Rfl:    RETIN-A  0.01 % gel, APPLY TO AFFECTED AREA EVERY DAY AT BEDTIME, Disp: 45 g, Rfl: 1   Flax Oil-Fish Oil-Borage Oil (FISH OIL-FLAX OIL-BORAGE OIL PO), Take by mouth. (Patient not taking: Reported on 08/15/2023), Disp: , Rfl:   Allergies  Allergen Reactions   Brimonidine Other (See Comments)    Eye redness   Penicillins Rash  Has patient had a PCN reaction causing immediate rash, facial/tongue/throat swelling, SOB or lightheadedness with hypotension: no Has patient had a PCN reaction causing severe rash involving mucus membranes or skin necrosis: no Has patient had a PCN reaction that required hospitalization no Has patient had a PCN reaction occurring within the last 10 years: No If all of the above answers are "NO", then may proceed with Cephalosporin use.    ROS neg/noncontributory except as noted HPI/below      Objective:      BP 122/82 (BP Location: Left Arm, Patient Position: Sitting, Cuff Size:  Normal)   Pulse 99   Temp 97.9 F (36.6 C) (Temporal)   Ht 5\' 4"  (1.626 m)   Wt 166 lb 12.8 oz (75.7 kg)   LMP 07/23/2023 (Exact Date)   SpO2 99%   BMI 28.63 kg/m  Wt Readings from Last 3 Encounters:  08/15/23 166 lb 12.8 oz (75.7 kg)  01/14/23 175 lb 4 oz (79.5 kg)  03/10/22 188 lb 4 oz (85.4 kg)    Physical Exam   Gen: [facility] NAD HEENT: NCAT, conjunctiva not injected, sclera nonicteric NECK:  supple, no thyromegaly, no nodes, no carotid bruits CARDIAC: RRR, S1S2+, no murmur. DP 2+B LUNGS: CTAB. No wheezes ABDOMEN:  BS+, soft, NTND, No HSM, no masses EXT:  no edema MSK: no gross abnormalities.  NEURO: A&O x3.  CN II-XII intact.  PSYCH: normal mood. Good eye contact  Hair pull-few strands every ara.  No bald spots     Assessment & Plan:  Vitamin D  deficiency -     VITAMIN D  25 Hydroxy (Vit-D Deficiency, Fractures)  Fatigue, unspecified type -     CBC with Differential/Platelet -     Comprehensive metabolic panel with GFR -     Hemoglobin A1c -     Vitamin B12 -     TSH -     IBC + Ferritin  Hair loss -     CBC with Differential/Platelet -     Comprehensive metabolic panel with GFR -     Hemoglobin A1c -     Vitamin B12 -     TSH -     IBC + Ferritin -     ANA w/Reflex if Positive -     Testosterone   Assessment and Plan Assessment & Plan Fatigue   Chronic fatigue lacks a clear cause, with potential contributors including inadequate sleep, heavy menstrual bleeding, and possible vitamin deficiencies. There are no signs of depression, suicidal ideation, anemia, heart issues, or other systemic problems. High testosterone  levels were previously noted, but gynecological evaluation showed no concerns.. Sleep deprivation from late bedtimes and early school wake-ups may contribute to fatigue. Order lab tests for B12, iron, and vitamin D  levels. Recommend a daily multivitamin with iron and advise improving sleep hygiene by going to bed earlier.  Heavy menstrual bleeding  with IUD   Heavy menstrual bleeding continues despite IUD placement, though the duration has decreased from nine to seven days. She uses about four pads on heavy days, which may contribute to fatigue.  Hair loss   Chronic hair thinning has occurred over the past two years, unrelated to anesthesia from eye surgeries. Possible causes include stress, nutritional deficiencies, or autoimmune conditions. High testosterone  levels were previously noted, but no polycystic ovary syndrome was diagnosed. Consider autoimmune disease if hair loss persists. Continue dermatology follow-up in September. Recommend a daily multivitamin and consider B complex vitamin supplementation.  Low vitamin D  levels  Previously low vitamin D  levels were noted at 24 ng/mL, with no recent checks since 2023. Vitamin D  supplementation was previously taken without symptom improvement. Order a vitamin D  level test and consider supplementation based on results.    Return in about 1 year (around 08/14/2024) for annual physical.  Ellsworth Haas, MD

## 2023-08-15 NOTE — Patient Instructions (Addendum)
 It was very nice to see you today!  Take vitamin with iron.   B complex vitamin   PLEASE NOTE:  If you had any lab tests please let us  know if you have not heard back within a few days. You may see your results on MyChart before we have a chance to review them but we will give you a call once they are reviewed by us . If we ordered any referrals today, please let us  know if you have not heard from their office within the next week.   Please try these tips to maintain a healthy lifestyle:  Eat most of your calories during the day when you are active. Eliminate processed foods including packaged sweets (pies, cakes, cookies), reduce intake of potatoes, white bread, white pasta, and white rice. Look for whole grain options, oat flour or almond flour.  Each meal should contain half fruits/vegetables, one quarter protein, and one quarter carbs (no bigger than a computer mouse).  Cut down on sweet beverages. This includes juice, soda, and sweet tea. Also watch fruit intake, though this is a healthier sweet option, it still contains natural sugar! Limit to 3 servings daily.  Drink at least 1 glass of water with each meal and aim for at least 8 glasses per day  Exercise at least 150 minutes every week.

## 2023-08-15 NOTE — Progress Notes (Signed)
 Testosterone  better-but will vary day by day and time of day Not concerned about the wbc but repeat 1 month for stability B12 low-take 1000mcg otc daily D is low-send rx for 50k/week-#13/1 Iron is ok but take vitamin with iron as we discussed

## 2023-08-16 ENCOUNTER — Ambulatory Visit: Payer: Self-pay | Admitting: *Deleted

## 2023-08-16 ENCOUNTER — Other Ambulatory Visit: Payer: Self-pay | Admitting: *Deleted

## 2023-08-16 DIAGNOSIS — D72819 Decreased white blood cell count, unspecified: Secondary | ICD-10-CM

## 2023-08-16 DIAGNOSIS — R7989 Other specified abnormal findings of blood chemistry: Secondary | ICD-10-CM

## 2023-08-16 LAB — ANA W/REFLEX IF POSITIVE: Anti Nuclear Antibody (ANA): NEGATIVE

## 2023-08-16 MED ORDER — VITAMIN D (ERGOCALCIFEROL) 1.25 MG (50000 UNIT) PO CAPS
50000.0000 [IU] | ORAL_CAPSULE | ORAL | 1 refills | Status: AC
Start: 2023-08-16 — End: ?

## 2023-08-16 NOTE — Telephone Encounter (Signed)
Already answered in another message

## 2023-08-26 ENCOUNTER — Other Ambulatory Visit: Payer: Self-pay | Admitting: *Deleted

## 2023-08-26 DIAGNOSIS — L700 Acne vulgaris: Secondary | ICD-10-CM

## 2023-08-26 MED ORDER — TRETINOIN 0.01 % EX GEL: Freq: Every day | CUTANEOUS | 1 refills | Status: DC

## 2023-09-05 DIAGNOSIS — Z947 Corneal transplant status: Secondary | ICD-10-CM | POA: Diagnosis not present

## 2023-09-15 ENCOUNTER — Ambulatory Visit: Payer: Self-pay | Admitting: Family Medicine

## 2023-09-15 ENCOUNTER — Other Ambulatory Visit (INDEPENDENT_AMBULATORY_CARE_PROVIDER_SITE_OTHER)

## 2023-09-15 DIAGNOSIS — Z419 Encounter for procedure for purposes other than remedying health state, unspecified: Secondary | ICD-10-CM | POA: Diagnosis not present

## 2023-09-15 DIAGNOSIS — D72819 Decreased white blood cell count, unspecified: Secondary | ICD-10-CM | POA: Diagnosis not present

## 2023-09-15 LAB — CBC WITH DIFFERENTIAL/PLATELET
Basophils Absolute: 0 10*3/uL (ref 0.0–0.1)
Basophils Relative: 0.6 % (ref 0.0–3.0)
Eosinophils Absolute: 0.1 10*3/uL (ref 0.0–0.7)
Eosinophils Relative: 2 % (ref 0.0–5.0)
HCT: 40.4 % (ref 36.0–46.0)
Hemoglobin: 13.2 g/dL (ref 12.0–15.0)
Lymphocytes Relative: 51.3 % — ABNORMAL HIGH (ref 12.0–46.0)
Lymphs Abs: 2.1 10*3/uL (ref 0.7–4.0)
MCHC: 32.7 g/dL (ref 30.0–36.0)
MCV: 88.7 fl (ref 78.0–100.0)
Monocytes Absolute: 0.4 10*3/uL (ref 0.1–1.0)
Monocytes Relative: 9.1 % (ref 3.0–12.0)
Neutro Abs: 1.5 10*3/uL (ref 1.4–7.7)
Neutrophils Relative %: 37 % — ABNORMAL LOW (ref 43.0–77.0)
Platelets: 214 10*3/uL (ref 150.0–400.0)
RBC: 4.56 Mil/uL (ref 3.87–5.11)
RDW: 12.9 % (ref 11.5–15.5)
WBC: 4 10*3/uL (ref 4.0–10.5)

## 2023-09-15 NOTE — Progress Notes (Signed)
 Cbc about same.  Not related to hair loss.  But repeat cbcd in 1 month to make sure no changes as borderline abnormal

## 2023-09-21 ENCOUNTER — Other Ambulatory Visit: Payer: Self-pay | Admitting: *Deleted

## 2023-09-21 DIAGNOSIS — D72819 Decreased white blood cell count, unspecified: Secondary | ICD-10-CM

## 2023-10-11 ENCOUNTER — Encounter: Payer: Self-pay | Admitting: Family Medicine

## 2023-10-15 DIAGNOSIS — Z419 Encounter for procedure for purposes other than remedying health state, unspecified: Secondary | ICD-10-CM | POA: Diagnosis not present

## 2023-10-26 ENCOUNTER — Other Ambulatory Visit (INDEPENDENT_AMBULATORY_CARE_PROVIDER_SITE_OTHER)

## 2023-10-26 DIAGNOSIS — D72819 Decreased white blood cell count, unspecified: Secondary | ICD-10-CM | POA: Diagnosis not present

## 2023-10-26 LAB — CBC WITH DIFFERENTIAL/PLATELET
Basophils Absolute: 0 K/uL (ref 0.0–0.1)
Basophils Relative: 0.4 % (ref 0.0–3.0)
Eosinophils Absolute: 0.1 K/uL (ref 0.0–0.7)
Eosinophils Relative: 2 % (ref 0.0–5.0)
HCT: 39.4 % (ref 36.0–46.0)
Hemoglobin: 13.1 g/dL (ref 12.0–15.0)
Lymphocytes Relative: 58 % — ABNORMAL HIGH (ref 12.0–46.0)
Lymphs Abs: 2.1 K/uL (ref 0.7–4.0)
MCHC: 33.2 g/dL (ref 30.0–36.0)
MCV: 87.7 fl (ref 78.0–100.0)
Monocytes Absolute: 0.3 K/uL (ref 0.1–1.0)
Monocytes Relative: 9.1 % (ref 3.0–12.0)
Neutro Abs: 1.1 K/uL — ABNORMAL LOW (ref 1.4–7.7)
Neutrophils Relative %: 30.5 % — ABNORMAL LOW (ref 43.0–77.0)
Platelets: 235 K/uL (ref 150.0–400.0)
RBC: 4.49 Mil/uL (ref 3.87–5.11)
RDW: 12.7 % (ref 11.5–15.5)
WBC: 3.7 K/uL — ABNORMAL LOW (ref 4.0–10.5)

## 2023-10-29 ENCOUNTER — Encounter: Payer: Self-pay | Admitting: Family Medicine

## 2023-10-30 ENCOUNTER — Ambulatory Visit: Payer: Self-pay | Admitting: Family Medicine

## 2023-10-30 NOTE — Progress Notes (Signed)
 Cbcd still abnormal-refer to hematology to see if anything needs to be done

## 2023-10-31 ENCOUNTER — Other Ambulatory Visit: Payer: Self-pay | Admitting: *Deleted

## 2023-10-31 DIAGNOSIS — D72819 Decreased white blood cell count, unspecified: Secondary | ICD-10-CM

## 2023-11-05 ENCOUNTER — Encounter: Payer: Self-pay | Admitting: Family Medicine

## 2023-11-07 ENCOUNTER — Other Ambulatory Visit: Payer: Self-pay | Admitting: Family Medicine

## 2023-11-07 MED ORDER — TRETINOIN 0.025 % EX GEL: Freq: Every day | CUTANEOUS | 1 refills | Status: DC

## 2023-11-15 DIAGNOSIS — Z419 Encounter for procedure for purposes other than remedying health state, unspecified: Secondary | ICD-10-CM | POA: Diagnosis not present

## 2023-12-12 DIAGNOSIS — H02844 Edema of left upper eyelid: Secondary | ICD-10-CM | POA: Diagnosis not present

## 2023-12-13 ENCOUNTER — Inpatient Hospital Stay: Attending: Oncology | Admitting: Oncology

## 2023-12-13 ENCOUNTER — Telehealth: Payer: Self-pay | Admitting: Oncology

## 2023-12-13 ENCOUNTER — Inpatient Hospital Stay

## 2023-12-13 ENCOUNTER — Encounter: Payer: Self-pay | Admitting: Oncology

## 2023-12-13 VITALS — BP 126/72 | HR 84 | Temp 98.0°F | Resp 18 | Ht 64.0 in | Wt 168.0 lb

## 2023-12-13 DIAGNOSIS — E559 Vitamin D deficiency, unspecified: Secondary | ICD-10-CM | POA: Diagnosis not present

## 2023-12-13 DIAGNOSIS — H409 Unspecified glaucoma: Secondary | ICD-10-CM | POA: Insufficient documentation

## 2023-12-13 DIAGNOSIS — D72819 Decreased white blood cell count, unspecified: Secondary | ICD-10-CM | POA: Insufficient documentation

## 2023-12-13 LAB — CBC WITH DIFFERENTIAL (CANCER CENTER ONLY)
Abs Immature Granulocytes: 0.01 K/uL (ref 0.00–0.07)
Basophils Absolute: 0 K/uL (ref 0.0–0.1)
Basophils Relative: 0 %
Eosinophils Absolute: 0.1 K/uL (ref 0.0–0.5)
Eosinophils Relative: 2 %
HCT: 39.5 % (ref 36.0–46.0)
Hemoglobin: 13.4 g/dL (ref 12.0–15.0)
Immature Granulocytes: 0 %
Lymphocytes Relative: 49 %
Lymphs Abs: 2.3 K/uL (ref 0.7–4.0)
MCH: 29.8 pg (ref 26.0–34.0)
MCHC: 33.9 g/dL (ref 30.0–36.0)
MCV: 87.8 fL (ref 80.0–100.0)
Monocytes Absolute: 0.5 K/uL (ref 0.1–1.0)
Monocytes Relative: 10 %
Neutro Abs: 1.9 K/uL (ref 1.7–7.7)
Neutrophils Relative %: 39 %
Platelet Count: 244 K/uL (ref 150–400)
RBC: 4.5 MIL/uL (ref 3.87–5.11)
RDW: 12.9 % (ref 11.5–15.5)
WBC Count: 4.8 K/uL (ref 4.0–10.5)
nRBC: 0 % (ref 0.0–0.2)

## 2023-12-13 LAB — CMP (CANCER CENTER ONLY)
ALT: 12 U/L (ref 0–44)
AST: 15 U/L (ref 15–41)
Albumin: 4.7 g/dL (ref 3.5–5.0)
Alkaline Phosphatase: 46 U/L (ref 38–126)
Anion gap: 13 (ref 5–15)
BUN: 15 mg/dL (ref 6–20)
CO2: 23 mmol/L (ref 22–32)
Calcium: 9.9 mg/dL (ref 8.9–10.3)
Chloride: 103 mmol/L (ref 98–111)
Creatinine: 0.56 mg/dL (ref 0.44–1.00)
GFR, Estimated: >60 mL/min (ref >60)
Glucose, Bld: 80 mg/dL (ref 70–99)
Potassium: 3.8 mmol/L (ref 3.5–5.1)
Sodium: 138 mmol/L (ref 135–145)
Total Bilirubin: 0.4 mg/dL (ref 0.0–1.2)
Total Protein: 7.5 g/dL (ref 6.5–8.1)

## 2023-12-13 LAB — VITAMIN D 25 HYDROXY (VIT D DEFICIENCY, FRACTURES): Vit D, 25-Hydroxy: 39.44 ng/mL (ref 30–100)

## 2023-12-13 LAB — FOLATE: Folate: >20 ng/mL (ref >5.9)

## 2023-12-13 LAB — LACTATE DEHYDROGENASE: LDH: 155 U/L (ref 98–192)

## 2023-12-13 LAB — IRON AND TIBC
Iron: 103 ug/dL (ref 28–170)
Saturation Ratios: 28 % (ref 10.4–31.8)
TIBC: 371 ug/dL (ref 250–450)
UIBC: 268 ug/dL

## 2023-12-13 LAB — FERRITIN: Ferritin: 85 ng/mL (ref 11–307)

## 2023-12-13 LAB — VITAMIN B12: Vitamin B-12: 335 pg/mL (ref 180–914)

## 2023-12-13 NOTE — Progress Notes (Signed)
 [facility]  HEMATOLOGY CLINIC CONSULTATION NOTE   PATIENT NAME: Jessica Barton   MR#: [id number] DOB: September 22, 1990  DATE OF SERVICE: 12/13/2023   REFERRING PROVIDER  Jessica Jenkins Jansky, MD   Patient Care Team: Jessica Jenkins Jansky, MD as PCP - General (Family Medicine)   REASON FOR CONSULTATION/ CHIEF COMPLAINT:  Evaluation of leukopenia  ASSESSMENT & PLAN:  Jessica Barton is a 33 y.o. lady with a past medical history of glaucoma, vitamin D  deficiency, was referred to our service for evaluation of intermittent mild leukopenia.  Likely benign leukopenia.  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   She has experienced consistently low white blood cell counts, with measurements of 3,800 in May, 4,000 in June, and 3,700 in July. Her lymphocyte percentage was elevated at 58% in July and 51% in June, while her neutrophil percentage was lower than normal at 30% in July.   Neutrophil count remains above 1000, reducing infection risk. No evidence of immature cells or concerning features.   Differential diagnosis includes monoclonal B lymphocytosis.   She reports no symptoms of recurrent or unusual infections and no indication of cancer.  Labs today actually showed normal white count of 4800 with no immature cells.  Neutrophils were 39%, lymphocytes were 49%, still slight lymphocyte predominance.  Hemoglobin 13.4, platelet count 244,000.  CMP, LDH, iron studies, B12, folate are all within normal limits.  Given slight lymphocyte predominance, we will proceed with flow cytometry of peripheral blood for further analysis.  - Plan phone call next week to discuss results.  - Schedule follow-up appointment in three months.  If stable labs, she can be discharged from our office after next visit.  She was provided reassurance   Vitamin D  deficiency Vitamin D  deficiency is being treated with high-dose vitamin D   supplementation for the past two to three months. She wishes to assess the effectiveness of the treatment. - Check vitamin D  levels to assess response to supplementation.   I reviewed lab results and outside records for this visit and discussed relevant results with the patient. Diagnosis, plan of care and treatment options were also discussed in detail with the patient. Opportunity provided to ask questions and answers provided to her apparent satisfaction. Provided instructions to call our clinic with any problems, questions or concerns prior to return visit. I recommended to continue follow-up with PCP and sub-specialists. She verbalized understanding and agreed with the plan. No barriers to learning was detected.  Jessica Patten, MD  12/13/2023 4:16 PM  [facility] A [facility] OF [facility] [id number] [place] [address] Dept: [phone] Dept Fax: [phone]   HISTORY OF PRESENT ILLNESS:  Discussed the use of AI scribe software for clinical note transcription with the patient, who gave verbal consent to proceed.  History of Present Illness Jessica Barton is a 33 year old female who presents with low white blood cell count and elevated lymphocytes. She was referred by Dr. Wendolyn due to consistently low white blood cell counts.  She has experienced consistently low white blood cell counts, with measurements of 3,800 in May, 4,000 in June, and 3,700 in July. Her lymphocyte percentage was elevated at 58% in July and 51% in June, while her neutrophil percentage was lower than normal at 30% in July. No recurrent infections, unusual infections, fevers, chills, or night sweats. She does not get sick  often.  ANA screen was negative in May 2025.  Her past medical history includes glaucoma, which she attributes to cataract surgery she had as a child. She has undergone eleven eye surgeries and is currently using eye drops for  glaucoma management. She sees Dr. Charlanne and Dr. Trudy at [facility] for her eye condition.  She is currently taking vitamin D  supplements due to previously low levels. Her iron and B12 levels were normal as of May. She has an IUD in place and reports that her menstrual cycles are not heavy, although they were heavy before the IUD.  Her family history includes a son who also has low white blood cell counts. She denies any significant family history of blood disorders.  She is not on any restricted diet but is trying to lose weight. No abdominal pain, feeling full after small meals, or any other symptoms that might suggest an enlarged liver or spleen.   MEDICAL HISTORY Past Medical History:  Diagnosis Date   Glaucoma (increased eye pressure)    Normal pregnancy in multigravida in third trimester 09/20/2015   Pregnancy induced hypertension    Preterm delivery 09/20/2015     SURGICAL HISTORY Past Surgical History:  Procedure Laterality Date   EYE SURGERY Left    mult surg   NOSE SURGERY       SOCIAL HISTORY: She reports that she has never smoked. She has never used smokeless tobacco. She reports that she does not drink alcohol and does not use drugs. Social History   Socioeconomic History   Marital status: Married    Spouse name: Not on file   Number of children: 3   Years of education: Not on file   Highest education level: Not on file  Occupational History   Not on file  Tobacco Use   Smoking status: Never   Smokeless tobacco: Never  Vaping Use   Vaping status: Never Used  Substance and Sexual Activity   Alcohol use: No   Drug use: No   Sexual activity: Yes    Birth control/protection: None  Other Topics Concern   Not on file  Social History Narrative   mom   Social Drivers of Health   Financial Resource Strain: Not on file  Food Insecurity: No Food Insecurity (12/13/2023)   Hunger Vital Sign    Worried About Running Out of Food in the Last Year: Never true    Ran  Out of Food in the Last Year: Never true  Transportation Needs: No Transportation Needs (12/13/2023)   PRAPARE - Administrator, Civil Service (Medical): No    Lack of Transportation (Non-Medical): No  Physical Activity: Not on file  Stress: Not on file  Social Connections: Not on file  Intimate Partner Violence: Not At Risk (12/13/2023)   Humiliation, Afraid, Rape, and Kick questionnaire    Fear of Current or Ex-Partner: No    Emotionally Abused: No    Physically Abused: No    Sexually Abused: No    FAMILY HISTORY: Her family history includes Diabetes in her father; Hyperlipidemia in her father; Hypertension in her father.  CURRENT MEDICATIONS   Current Outpatient Medications  Medication Instructions   Cholecalciferol (D3 PO) Daily   Dorzolamide  HCl-Timolol  Mal PF 2-0.5 % SOLN 1 drop, 2 times daily   dorzolamide -timolol  (COSOPT ) 2-0.5 % ophthalmic solution Apply to eye.   doxycycline (MONODOX) 100 mg, 2 times daily   latanoprost (XALATAN) 0.005 % ophthalmic solution 1 drop, Daily at bedtime  Magnesium  Bisglycinate (MAG GLYCINATE) 100 MG TABS Take by mouth.   paragard  intrauterine copper  IUD IUD 1 [facility]   prednisoLONE acetate (PRED FORTE) 1 % ophthalmic suspension 1 drop, 4 times daily   tretinoin  (RETIN-A ) 0.025 % gel Topical, Daily at bedtime   Vitamin D  (Ergocalciferol ) (DRISDOL ) 50,000 Units, Oral, Every 7 days     ALLERGIES  She is allergic to brimonidine and penicillins.  REVIEW OF SYSTEMS:  Review of Systems - Oncology   Rest of the pertinent review of systems is unremarkable except as mentioned above in HPI.  PHYSICAL EXAMINATION:   Onc Performance Status - 12/13/23 1021       ECOG Perf Status   ECOG Perf Status Restricted in physically strenuous activity but ambulatory and able to carry out work of a light or sedentary nature, e.g., light house work, office work      KPS SCALE   KPS % SCORE Able to carry on normal activity,  minor s/s of disease          Vitals:   12/13/23 1010  BP: 126/72  Pulse: 84  Resp: 18  Temp: 98 F (36.7 C)  SpO2: 100%   Filed Weights   12/13/23 1010  Weight: 168 lb (76.2 kg)    Physical Exam Constitutional:      General: She is not in acute distress.    Appearance: Normal appearance.  HENT:     Head: Normocephalic and atraumatic.  Cardiovascular:     Rate and Rhythm: Normal rate.  Pulmonary:     Effort: Pulmonary effort is normal. No respiratory distress.  Abdominal:     General: There is no distension.     Palpations: Abdomen is soft. There is no mass.  Neurological:     General: No focal deficit present.     Mental Status: She is alert and oriented to person, place, and time.  Psychiatric:        Mood and Affect: Mood normal.        Behavior: Behavior normal.     LABORATORY DATA:   I have reviewed the data as listed.  Results for orders placed or performed in visit on 12/13/23  Vitamin B12  Result Value Ref Range   Vitamin B-12 335 180 - 914 pg/mL  Folate  Result Value Ref Range   Folate >20.0 >5.9 ng/mL  Ferritin  Result Value Ref Range   Ferritin 85 11 - 307 ng/mL  Iron and TIBC  Result Value Ref Range   Iron 103 28 - 170 ug/dL   TIBC 628 749 - 549 ug/dL   Saturation Ratios 28 10.4 - 31.8 %   UIBC 268 ug/dL  Lactate dehydrogenase  Result Value Ref Range   LDH 155 98 - 192 U/L  CMP ([facility] only)  Result Value Ref Range   Sodium 138 135 - 145 mmol/L   Potassium 3.8 3.5 - 5.1 mmol/L   Chloride 103 98 - 111 mmol/L   CO2 23 22 - 32 mmol/L   Glucose, Bld 80 70 - 99 mg/dL   BUN 15 6 - 20 mg/dL   Creatinine 9.43 9.55 - 1.00 mg/dL   Calcium 9.9 8.9 - 89.6 mg/dL   Total Protein 7.5 6.5 - 8.1 g/dL   Albumin 4.7 3.5 - 5.0 g/dL   AST 15 15 - 41 U/L   ALT 12 0 - 44 U/L   Alkaline Phosphatase 46 38 - 126 U/L   Total Bilirubin  0.4 0.0 - 1.2 mg/dL   GFR, Estimated >39 >39 mL/min   Anion gap 13 5 - 15  CBC with Differential ([facility]cer  [facility] Only)  Result Value Ref Range   WBC Count 4.8 4.0 - 10.5 K/uL   RBC 4.50 3.87 - 5.11 MIL/uL   Hemoglobin 13.4 12.0 - 15.0 g/dL   HCT 60.4 63.9 - 53.9 %   MCV 87.8 80.0 - 100.0 fL   MCH 29.8 26.0 - 34.0 pg   MCHC 33.9 30.0 - 36.0 g/dL   RDW 87.0 88.4 - 84.4 %   Platelet Count 244 150 - 400 K/uL   nRBC 0.0 0.0 - 0.2 %   Neutrophils Relative % 39 %   Neutro Abs 1.9 1.7 - 7.7 K/uL   Lymphocytes Relative 49 %   Lymphs Abs 2.3 0.7 - 4.0 K/uL   Monocytes Relative 10 %   Monocytes Absolute 0.5 0.1 - 1.0 K/uL   Eosinophils Relative 2 %   Eosinophils Absolute 0.1 0.0 - 0.5 K/uL   Basophils Relative 0 %   Basophils Absolute 0.0 0.0 - 0.1 K/uL   Immature Granulocytes 0 %   Abs Immature Granulocytes 0.01 0.00 - 0.07 K/uL     RADIOGRAPHIC STUDIES:  No pertinent imaging studies available to review.  Orders Placed This Encounter  Procedures   CBC with Differential ([facility] Only)    Standing Status:   Future    Number of Occurrences:   1    Expiration Date:   12/12/2024   CMP ([facility] only)    Standing Status:   Future    Number of Occurrences:   1    Expiration Date:   12/12/2024   Lactate dehydrogenase    Standing Status:   Future    Number of Occurrences:   1    Expiration Date:   12/12/2024   Iron and TIBC    Standing Status:   Future    Number of Occurrences:   1    Expiration Date:   12/12/2024   Ferritin    Standing Status:   Future    Number of Occurrences:   1    Expiration Date:   12/12/2024   Folate    Standing Status:   Future    Number of Occurrences:   1    Expiration Date:   12/12/2024   Vitamin B12    Standing Status:   Future    Number of Occurrences:   1    Expiration Date:   12/12/2024   Flow Cytometry, Peripheral Blood (Oncology)    Standing Status:   Future    Number of Occurrences:   1    Expiration Date:   12/12/2024   Vitamin D  25 hydroxy    Standing Status:   Future    Number of Occurrences:   1    Expiration Date:   12/12/2024    Future  Appointments  Date Time Provider Department Center  12/20/2023 10:00 AM Alm Delon SAILOR, DO [facility] None  12/20/2023  3:35 PM Taffie Eckmann, Chinita, MD [facility] None  01/17/2024  9:00 AM Jessica Jenkins Jansky, MD [facility]  03/13/2024  9:30 AM [facility] PHLEBOTOMIST [facility] None  03/13/2024  9:45 AM Wilena Tyndall, Chinita, MD [facility] None    I spent a total of 55 minutes during this encounter with the patient including review of chart and various tests results, discussions about plan of care and coordination of care plan.  This document was  completed utilizing speech recognition software. Grammatical errors, random word insertions, pronoun errors, and incomplete sentences are an occasional consequence of this system due to software limitations, ambient noise, and hardware issues. Any formal questions or concerns about the content, text or information contained within the body of this dictation should be directly addressed to the provider for clarification.

## 2023-12-13 NOTE — Telephone Encounter (Signed)
 Patient has been scheduled for follow-up visit per 12/13/23 LOS.  Pt noted appt details on personal electronic device.

## 2023-12-13 NOTE — Assessment & Plan Note (Signed)
 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   She has experienced consistently low white blood cell counts, with measurements of 3,800 in May, 4,000 in June, and 3,700 in July. Her lymphocyte percentage was elevated at 58% in July and 51% in June, while her neutrophil percentage was lower than normal at 30% in July.   Neutrophil count remains above 1000, reducing infection risk. No evidence of immature cells or concerning features.   Differential diagnosis includes monoclonal B lymphocytosis.   She reports no symptoms of recurrent or unusual infections and no indication of cancer.  Labs today actually showed normal white count of 4800 with no immature cells.  Neutrophils were 39%, lymphocytes were 49%, still slight lymphocyte predominance.  Hemoglobin 13.4, platelet count 244,000.  CMP, LDH, iron studies, B12, folate are all within normal limits.  Given slight lymphocyte predominance, we will proceed with flow cytometry of peripheral blood for further analysis.  - Plan phone call next week to discuss results.  - Schedule follow-up appointment in three months.  If stable labs, she can be discharged from our office after next visit.  She was provided reassurance

## 2023-12-15 LAB — SURGICAL PATHOLOGY

## 2023-12-16 DIAGNOSIS — Z419 Encounter for procedure for purposes other than remedying health state, unspecified: Secondary | ICD-10-CM | POA: Diagnosis not present

## 2023-12-16 LAB — FLOW CYTOMETRY

## 2023-12-20 ENCOUNTER — Ambulatory Visit (INDEPENDENT_AMBULATORY_CARE_PROVIDER_SITE_OTHER): Admitting: Dermatology

## 2023-12-20 ENCOUNTER — Encounter: Payer: Self-pay | Admitting: Dermatology

## 2023-12-20 ENCOUNTER — Inpatient Hospital Stay (HOSPITAL_BASED_OUTPATIENT_CLINIC_OR_DEPARTMENT_OTHER): Admitting: Oncology

## 2023-12-20 VITALS — BP 113/65

## 2023-12-20 DIAGNOSIS — D72819 Decreased white blood cell count, unspecified: Secondary | ICD-10-CM

## 2023-12-20 DIAGNOSIS — L819 Disorder of pigmentation, unspecified: Secondary | ICD-10-CM | POA: Diagnosis not present

## 2023-12-20 DIAGNOSIS — R21 Rash and other nonspecific skin eruption: Secondary | ICD-10-CM | POA: Diagnosis not present

## 2023-12-20 DIAGNOSIS — L650 Telogen effluvium: Secondary | ICD-10-CM

## 2023-12-20 DIAGNOSIS — E559 Vitamin D deficiency, unspecified: Secondary | ICD-10-CM | POA: Diagnosis not present

## 2023-12-20 DIAGNOSIS — L810 Postinflammatory hyperpigmentation: Secondary | ICD-10-CM

## 2023-12-20 MED ORDER — TRETINOIN 0.025 % EX CREA: TOPICAL_CREAM | Freq: Every day | CUTANEOUS | 0 refills | Status: AC

## 2023-12-20 MED ORDER — SAFETY SEAL MISCELLANEOUS MISC: 5 refills | Status: AC

## 2023-12-20 NOTE — Patient Instructions (Addendum)
 Date: Tue Dec 20 2023  Hello [facility],  Thank you for visiting today. Here is a summary of the key instructions:  - Medications:   - Start taking Viviscal supplement to boost hair growth   - Apply 8% minoxidil solution to scalp in the morning   - Continue tretinoin  0.025% cream two nights per week   - Use Eucerin Radiant Tone with thiamidol morning and night for hyperpigmentation   - Take Vital Proteins collagen supplement  - Skin Care:   - Apply sunscreen daily in the morning   - Use Vanicream light moisturizer in the morning   - Use Vanicream tub moisturizer at night  - Follow-up:   - Return for follow-up appointment in 6 months  - Other Instructions:   - Part hair, apply minoxidil solution, and rub in   - Do not apply minoxidil at night   - May reduce tretinoin  to once a week in winter   [facility] pharmacy will call to verify address for minoxidil delivery   - Samples of Eucerin Radiant Tone provided in office  Please reach out if you have any questions or concerns.  Warm regards,  Dr. Delon [facility]             Important Information  Due to recent changes in healthcare laws, you may see results of your pathology and/or laboratory studies on MyChart before the doctors have had a chance to review them. We understand that in some cases there may be results that are confusing or concerning to you. Please understand that not all results are received at the same time and often the doctors may need to interpret multiple results in order to provide you with the best plan of care or course of treatment. Therefore, we ask that you please give us  2 business days to thoroughly review all your results before contacting the office for clarification. Should we see a critical lab result, you will be contacted sooner.   If You Need Anything After Your Visit  If you have any questions or concerns for your doctor, please call our main line at [phone] If no one  answers, please leave a voicemail as directed and we will return your call as soon as possible. Messages left after 4 pm will be answered the following business day.   You may also send us  a message via MyChart. We typically respond to MyChart messages within 1-2 business days.  For prescription refills, please ask your pharmacy to contact our office. Our fax number is [phone].  If you have an urgent issue when the clinic is closed that cannot wait until the next business day, you can page your doctor at the number below.    Please note that while we do our best to be available for urgent issues outside of office hours, we are not available 24/7.   If you have an urgent issue and are unable to reach us , you may choose to seek medical care at your doctor's office, retail clinic, urgent care center, or emergency room.  If you have a medical emergency, please immediately call 911 or go to the emergency department. In the event of inclement weather, please call our main line at [phone] for an update on the status of any delays or closures.  Dermatology Medication Tips: Please keep the boxes that topical medications come in in order to help keep track of the instructions about where and how to use these. Pharmacies typically print the medication  instructions only on the boxes and not directly on the medication tubes.   If your medication is too expensive, please contact our office at [phone] or send us  a message through MyChart.   We are unable to tell what your co-pay for medications will be in advance as this is different depending on your insurance coverage. However, we may be able to find a substitute medication at lower cost or fill out paperwork to get insurance to cover a needed medication.   If a prior authorization is required to get your medication covered by your insurance company, please allow us  1-2 business days to complete this process.  Drug prices often vary  depending on where the prescription is filled and some pharmacies may offer cheaper prices.  The website www.goodrx.com contains coupons for medications through different pharmacies. The prices here do not account for what the cost may be with help from insurance (it may be cheaper with your insurance), but the website can give you the price if you did not use any insurance.  - You can print the associated coupon and take it with your prescription to the pharmacy.  - You may also stop by our office during regular business hours and pick up a GoodRx coupon card.  - If you need your prescription sent electronically to a different pharmacy, notify our office through [facility] or by phone at [phone]

## 2023-12-20 NOTE — Progress Notes (Signed)
 [facility]  HEMATOLOGY-ONCOLOGY ELECTRONIC VISIT PROGRESS NOTE  PATIENT NAME: [facility]   MR#: [id number] DOB: 10-16-1990  DATE OF SERVICE: 12/20/2023  Patient Care Team: Jessica Jenkins Jansky, MD as PCP - General (Family Medicine)  I connected with the patient via telephone conference and verified that I am speaking with the correct person using two identifiers. The patient's location is at home and I am providing care from the [facility].  I discussed the limitations, risks, security and privacy concerns of performing an evaluation and management service by e-visits and the availability of in person appointments. I also discussed with the patient that there may be a patient responsible charge related to this service. The patient expressed understanding and agreed to proceed.   ASSESSMENT & PLAN:   [facility] is a 33 y.o.  lady with a past medical history of glaucoma, vitamin D  deficiency, was referred to our service in September 2025 for evaluation of intermittent mild leukopenia.  Likely benign leukopenia.   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   She has experienced consistently low white blood cell counts, with measurements of 3,800 in May, 4,000 in June, and 3,700 in July. Her lymphocyte percentage was elevated at 58% in July and 51% in June, while her neutrophil percentage was lower than normal at 30% in July.   Neutrophil count remains above 1000, reducing infection risk. No evidence of immature cells or concerning features.   On her consultation with us  on 12/13/2023, labs actually showed normal white count of 4800 with no immature cells.  Neutrophils were 39%, lymphocytes were 49%, still slight lymphocyte predominance.  Hemoglobin 13.4, platelet count 244,000.  CMP, LDH, iron studies, B12, folate were all within normal limits.   Given slight lymphocyte predominance, we obtained flow  cytometry of peripheral blood and it was unremarkable.   Since all hematological workup was negative and no additional workup or intervention is needed from hematology standpoint, she was discharged from our office for continued follow-up with her PCP.   Vitamin D  deficiency Vitamin D  levels have improved with supplementation of 50,000 IU weekly. - Complete remaining two doses of 50,000 IU Vitamin D . - Switch to 1,000 IU Vitamin D  daily after completing current prescription.  I discussed the assessment and treatment plan with the patient. The patient was provided an opportunity to ask questions and all were answered. The patient agreed with the plan and demonstrated an understanding of the instructions. The patient was advised to call back or seek an in-person evaluation if the symptoms worsen or if the condition fails to improve as anticipated.    I spent 11 minutes over the phone with the patient reviewing test results, discuss management and coordination/planning of care.  Jessica Patten, MD 12/20/2023 3:39 PM [facility] [address] [place] [address] Dept: [phone] Dept Fax: [phone]   INTERVAL HISTORY:  Please see above for problem oriented charting.  The purpose of today's discussion is to explain recent lab results and to formulate plan of care.  Discussed the use of AI scribe software for clinical note transcription with the patient, who gave verbal consent to proceed.  History of Present Illness [facility] is a 33 year old female who presents for follow-up of hematological workup results.  She underwent a comprehensive hematological workup last week, which included  a complete blood count and flow cytometry of peripheral blood. Her white blood cell count was 4,800, compared to previous counts of 3,700 and 3,800. Red blood cell and platelet counts were normal.  Lymphocytes were slightly elevated at 49%, improved from 58% previously. Neutrophils and other blood counts were in appropriate proportions.  Additional tests for iron, B12, folic acid, and vitamin D  levels were conducted. All results were normal, with vitamin D  levels showing improvement. She is currently taking Vitamin D  50,000 IU once a week and has two tablets remaining.    SUMMARY OF HEMATOLOGY HISTORY:  She was referred by Dr. Wendolyn due to consistently low white blood cell counts.   She has experienced consistently low white blood cell counts, with measurements of 3,800 in May, 4,000 in June, and 3,700 in July. Her lymphocyte percentage was elevated at 58% in July and 51% in June, while her neutrophil percentage was lower than normal at 30% in July. No recurrent infections, unusual infections, fevers, chills, or night sweats. She does not get sick often.   ANA screen was negative in May 2025.   Her past medical history includes glaucoma, which she attributes to cataract surgery she had as a child. She has undergone eleven eye surgeries and is currently using eye drops for glaucoma management. She sees Dr. Charlanne and Dr. Trudy at [facility] for her [facility] condition.   She is currently taking vitamin D  supplements due to previously low levels. Her iron and B12 levels were normal as of May. She has an IUD in place and reports that her menstrual cycles are not heavy, although they were heavy before the IUD.   Her family history includes a son who also has low white blood cell counts. She denies any significant family history of blood disorders.   She is not on any restricted diet but is trying to lose weight. No abdominal pain, feeling full after small meals, or any other symptoms that might suggest an enlarged liver or spleen.  Differential diagnosis includes monoclonal B lymphocytosis.    She reports no symptoms of recurrent or unusual infections and no indication of cancer.   On her  consultation with us  on 12/13/2023, labs actually showed normal white count of 4800 with no immature cells.  Neutrophils were 39%, lymphocytes were 49%, still slight lymphocyte predominance.  Hemoglobin 13.4, platelet count 244,000.  CMP, LDH, iron studies, B12, folate were all within normal limits.   Given slight lymphocyte predominance, we obtained flow cytometry of peripheral blood and it was unremarkable.   Since all hematological workup was negative and no additional workup or intervention is needed from hematology standpoint, she was discharged from our office for continued follow-up with her PCP.  REVIEW OF SYSTEMS:    Review of Systems - Oncology  All other pertinent systems were reviewed with the patient and are negative.  I have reviewed the past medical history, past surgical history, social history and family history with the patient and they are unchanged from previous note.  ALLERGIES:  She is allergic to brimonidine and penicillins.  MEDICATIONS:  Current Outpatient Medications  Medication Sig Dispense Refill   Cholecalciferol (D3 PO) Take by mouth daily.     Dorzolamide  HCl-Timolol  Mal PF 2-0.5 % SOLN Place 1 drop into the left eye 2 (two) times daily.     dorzolamide -timolol  (COSOPT ) 2-0.5 % ophthalmic solution Apply to eye.     doxycycline (MONODOX) 100 MG capsule Take 100 mg by mouth 2 (two) times daily.  latanoprost (XALATAN) 0.005 % ophthalmic solution Place 1 drop into the left eye at bedtime.     Magnesium  Bisglycinate (MAG GLYCINATE) 100 MG TABS Take by mouth. (Patient taking differently: Take by mouth. Patient only takes 3 times per month for sleep)     paragard  intrauterine copper  IUD IUD 1 device provided by [facility]     prednisoLONE acetate (PRED FORTE) 1 % ophthalmic suspension Place 1 drop into the left eye 4 (four) times daily.     Safety Seal Miscellaneous MISC minoxiil 8% solution - apply to the scalp every morning 30 mL 5   tretinoin  (RETIN-A ) 0.025 %  cream Apply topically at bedtime. Use pea size (1gm) amount to face 3 nights a week. 45 g 0   Vitamin D , Ergocalciferol , (DRISDOL ) 1.25 MG (50000 UNIT) CAPS capsule Take 1 capsule (50,000 Units total) by mouth every 7 (seven) days. 13 capsule 1   No current facility-administered medications for this visit.    PHYSICAL EXAMINATION:    Onc Performance Status - 12/20/23 1500       ECOG Perf Status   ECOG Perf Status Restricted in physically strenuous activity but ambulatory and able to carry out work of a light or sedentary nature, e.g., light house work, office work      KPS SCALE   KPS % SCORE Able to carry on normal activity, minor s/s of disease          LABORATORY DATA:   I have reviewed the data as listed.  Recent Results (from the past 2160 hours)  CBC w/Diff     Status: Abnormal   Collection Time: 10/26/23  9:44 AM  Result Value Ref Range   WBC 3.7 (L) 4.0 - 10.5 K/uL   RBC 4.49 3.87 - 5.11 Mil/uL   Hemoglobin 13.1 12.0 - 15.0 g/dL   HCT 60.5 63.9 - 53.9 %   MCV 87.7 78.0 - 100.0 fl   MCHC 33.2 30.0 - 36.0 g/dL   RDW 87.2 88.4 - 84.4 %   Platelets 235.0 150.0 - 400.0 K/uL   Neutrophils Relative % 30.5 (L) 43.0 - 77.0 %   Lymphocytes Relative 58.0 Repeated and verified X2. (H) 12.0 - 46.0 %   Monocytes Relative 9.1 3.0 - 12.0 %   Eosinophils Relative 2.0 0.0 - 5.0 %   Basophils Relative 0.4 0.0 - 3.0 %   Neutro Abs 1.1 (L) 1.4 - 7.7 K/uL   Lymphs Abs 2.1 0.7 - 4.0 K/uL   Monocytes Absolute 0.3 0.1 - 1.0 K/uL   Eosinophils Absolute 0.1 0.0 - 0.7 K/uL   Basophils Absolute 0.0 0.0 - 0.1 K/uL  Surgical pathology     Status: None   Collection Time: 12/13/23 12:00 AM  Result Value Ref Range   SURGICAL PATHOLOGY      Surgical Pathology CASE: [id number] PATIENT: Tamyah Jeffrey Flow Pathology Report     Clinical history: Leukopenia, unspecified type     DIAGNOSIS:  - No abnormal B or T-cell population identified   GATING AND PHENOTYPIC  ANALYSIS:  Gated population: Flow cytometric immunophenotyping is performed using antibodies to the antigens listed in the table below. Electronic gates are placed around a cell cluster displaying light scatter properties corresponding to: lymphocytes  Abnormal Cells in gated population: N/A  Phenotype of Abnormal Cells: N/A                       Lymphoid Antigens       Myeloid  Antigens Miscellaneous CD2  tested    CD10 tested    CD11b     ND   CD45 tested CD3  tested    CD19 tested    CD11c     ND   HLA-Dr    ND CD4  tested    CD20 tested    CD13 ND   CD34 tested CD5  tested    CD22 ND   CD14 ND   CD38 tested CD7  tested    CD79b     ND   CD15 ND   CD138     ND CD8  tested    CD103     ND   CD16 ND   TdT  ND CD25 ND   CD200     te sted    CD33 ND   CD123     ND TCRab     ND   sKappa    tested    CD64 ND   CD41 ND TCRgd     tested    sLambda   tested    CD117     ND   CD61 ND CD56 tested    cKappa    ND   MPO  ND   CD71 ND CD57 ND   cLambda   ND        [id number]      GROSS DESCRIPTION:  One lavender top tube submitted from [facility] for lymphoma testing.    Final Diagnosis performed by Ilsa Pottier, MD.   Electronically signed 12/15/2023 Technical and / or Professional components performed at [facility], [address], [place], [place] [address].  The above tests were developed and their performance characteristics determined by the [facility] system for the physical and immunophenotypic characterization of cell populations. They have not been cleared by the U.S. Food and Drug administration. The  FDA has determined that such clearance or approval is not necessary. This test is used for clinical purposes. It should not be  regarded as investigational or for re search   Vitamin D  25 hydroxy     Status: None   Collection Time: 12/13/23 11:12 AM  Result Value Ref Range   Vit D, 25-Hydroxy 39.44 30 - 100 ng/mL    Comment: (NOTE) Vitamin D   deficiency has been defined by the Institute of Medicine  and an Endocrine Society practice guideline as a level of serum 25-OH  vitamin D  less than 20 ng/mL (1,2). The Endocrine Society went on to  further define vitamin D  insufficiency as a level between 21 and 29  ng/mL (2).  1. IOM (Institute of Medicine). 2010. Dietary reference intakes for  calcium and D. [place]: [employer]. 2. Holick MF, Binkley [place], Bischoff-Ferrari HA, et al. Evaluation,  treatment, and prevention of vitamin D  deficiency: an Endocrine  Society clinical practice guideline, JCEM. 2011 Jul; 96(7): 1911-30.  Performed at [facility], [address] [place], [place] [address]   Flow Cytometry, Peripheral Blood (Oncology)     Status: None   Collection Time: 12/13/23 11:12 AM  Result Value Ref Range   Flow Cytometry SEE SEPARATE REPORT     Comment: Performed at [facility], [address] [place], [place] [address]  Vitamin B12     Status: None   Collection Time: 12/13/23 11:12 AM  Result Value Ref Range   Vitamin B-12 335 180 - 914 pg/mL    Comment: (NOTE) This assay is  not validated for testing neonatal or myeloproliferative syndrome specimens for Vitamin B12 levels. Performed at [facility], [address], [place], [place] [address]   Folate     Status: None   Collection Time: 12/13/23 11:12 AM  Result Value Ref Range   Folate >20.0 >5.9 ng/mL    Comment: Performed at [facility], [address] [place], [place] [address]  Ferritin     Status: None   Collection Time: 12/13/23 11:12 AM  Result Value Ref Range   Ferritin 85 11 - 307 ng/mL    Comment: Performed at [facility], [address] [place], [place] [address]  Iron and TIBC     Status: None   Collection Time: 12/13/23 11:12 AM  Result Value Ref Range   Iron 103 28 - 170 ug/dL   TIBC 628 749 - 549 ug/dL   Saturation Ratios 28 10.4 - 31.8 %   UIBC 268  ug/dL    Comment: Performed at [facility], [address] [place], [place] [address]  Lactate dehydrogenase     Status: None   Collection Time: 12/13/23 11:12 AM  Result Value Ref Range   LDH 155 98 - 192 U/L    Comment: Performed at [facility], [address] [place], [place] [address]  CMP (Cancer Center only)     Status: None   Collection Time: 12/13/23 11:12 AM  Result Value Ref Range   Sodium 138 135 - 145 mmol/L   Potassium 3.8 3.5 - 5.1 mmol/L   Chloride 103 98 - 111 mmol/L   CO2 23 22 - 32 mmol/L   Glucose, Bld 80 70 - 99 mg/dL    Comment: Glucose reference range applies only to samples taken after fasting for at least 8 hours.   BUN 15 6 - 20 mg/dL   Creatinine 9.43 9.55 - 1.00 mg/dL   Calcium 9.9 8.9 - 89.6 mg/dL   Total Protein 7.5 6.5 - 8.1 g/dL   Albumin 4.7 3.5 - 5.0 g/dL   AST 15 15 - 41 U/L   ALT 12 0 - 44 U/L   Alkaline Phosphatase 46 38 - 126 U/L   Total Bilirubin 0.4 0.0 - 1.2 mg/dL   GFR, Estimated >39 >39 mL/min    Comment: (NOTE) Calculated using the CKD-EPI Creatinine Equation (2021)    Anion gap 13 5 - 15    Comment: Performed at [facility], [address], [place], [place] [address]  CBC with Differential (Cancer Center Only)     Status: None   Collection Time: 12/13/23 11:12 AM  Result Value Ref Range   WBC Count 4.8 4.0 - 10.5 K/uL   RBC 4.50 3.87 - 5.11 MIL/uL   Hemoglobin 13.4 12.0 - 15.0 g/dL   HCT 60.4 63.9 - 53.9 %   MCV 87.8 80.0 - 100.0 fL   MCH 29.8 26.0 - 34.0 pg   MCHC 33.9 30.0 - 36.0 g/dL   RDW 87.0 88.4 - 84.4 %   Platelet Count 244 150 - 400 K/uL   nRBC 0.0 0.0 - 0.2 %   Neutrophils Relative % 39 %   Neutro Abs 1.9 1.7 - 7.7 K/uL   Lymphocytes Relative 49 %   Lymphs Abs 2.3 0.7 - 4.0 K/uL   Monocytes Relative 10 %   Monocytes Absolute 0.5 0.1 - 1.0 K/uL   Eosinophils Relative 2 %   Eosinophils Absolute 0.1 0.0 - 0.5 K/uL  Basophils Relative 0 %   Basophils Absolute 0.0  0.0 - 0.1 K/uL   Immature Granulocytes 0 %   Abs Immature Granulocytes 0.01 0.00 - 0.07 K/uL    Comment: Performed at [facility], [address], [place], [place] [address]     RADIOGRAPHIC STUDIES:  No recent pertinent imaging studies available to review.  No orders of the defined types were placed in this encounter.    Future Appointments  Date Time Provider Department Center  01/17/2024  9:00 AM [facility] Jenkins Jansky, MD [facility]  06/18/2024 10:45 AM Alm Delon SAILOR, DO [facility] None    This document was completed utilizing speech recognition software. Grammatical errors, random word insertions, pronoun errors, and incomplete sentences are an occasional consequence of this system due to software limitations, ambient noise, and hardware issues. Any formal questions or concerns about the content, text or information contained within the body of this dictation should be directly addressed to the provider for clarification.

## 2023-12-20 NOTE — Progress Notes (Signed)
   New Patient Visit   Subjective  Jessica Barton is a 33 y.o. female who presents for a NEW PATIENT appointment to be examined for the concerns as listed below.   Hair Loss: Pt noticed increase hair shedding during daily hair maintenance 2 years ago. She mentioned that she gave birth 3 years ago and expected some hair shedding but not at the rate it has continued to be. Her scalp is not tender or itchy. She has been taking vit D, multivitamin and B12 daily in an attempt to see improvement but it was ineffective. She has not had any Rx or OTC for this issue.    Are you nursing, pregnant or trying to conceive? No   No Hx of Bx. No family Hx of skin cancer.   The following portions of the chart were reviewed this encounter and updated as appropriate: medications, allergies, medical history  Review of Systems:  No other skin or systemic complaints except as noted in HPI or Assessment and Plan.  Objective  Well appearing patient in no apparent distress; mood and affect are within normal limits.   A focused examination was performed of the following areas: scalp   Relevant exam findings are noted in the Assessment and Plan.             Assessment & Plan    1. Telogen Effluvium - Assessment: Patient reports persistent hair thinning and shedding since third pregnancy. Presentation consistent with telogen effluvium, likely triggered by physiological stress of childbirth and exacerbated by recent eye surgeries. Family history significant for female pattern baldness in father.  - Plan:    Start Viviscal supplement to promote hair growth    Initiate topical minoxidil 8% compounded applied in the morning    Begin Vital Proteins collagen supplement    Apply minoxidil in the morning by parting hair, applying, and rubbing in    Avoid nighttime application of minoxidil to prevent unwanted facial hair growth    Patient education provided on natural response to stress and expected recovery  with time and treatment  2. Hyperpigmentation with Skin Irritation - Assessment: Patient reports hyperpigmentation and skin irritation. Currently using tretinoin  0.025% cream twice weekly, which may be contributing to irritation. Moisturizing with Vanicream HC. - Plan:    Continue tretinoin  0.025% cream twice weekly    Initiate Excedrin Radiant Tone with thiametol twice daily    Apply in the morning and night, followed by sunscreen in the morning and tretinoin  at night    Use Vanicream tub formulation at night    Use Vanicream light formulation in the morning    Continue current sunscreen use    Increase tretinoin  to 0.05% strength in 6 months    Consider reducing tretinoin  use to once weekly during winter months if irritation persists  Follow-up in 6 months to reassess hair growth response and skin treatment modifications. TELOGEN EFFLUVIUM   Related Medications Safety Seal Miscellaneous MISC minoxiil 8% solution - apply to the scalp every morning  Return in about 6 months (around 06/18/2024) for ALOPECIA.   Documentation: I have reviewed the above documentation for accuracy and completeness, and I agree with the above.  I, Shirron Maranda, CMA, am acting as scribe for [employer], DO.   Delon Lenis, DO

## 2023-12-26 ENCOUNTER — Encounter: Payer: Self-pay | Admitting: Oncology

## 2023-12-26 NOTE — Assessment & Plan Note (Signed)
 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   She has experienced consistently low white blood cell counts, with measurements of 3,800 in May, 4,000 in June, and 3,700 in July. Her lymphocyte percentage was elevated at 58% in July and 51% in June, while her neutrophil percentage was lower than normal at 30% in July.   Neutrophil count remains above 1000, reducing infection risk. No evidence of immature cells or concerning features.   On her consultation with us  on 12/13/2023, labs actually showed normal white count of 4800 with no immature cells.  Neutrophils were 39%, lymphocytes were 49%, still slight lymphocyte predominance.  Hemoglobin 13.4, platelet count 244,000.  CMP, LDH, iron studies, B12, folate were all within normal limits.   Given slight lymphocyte predominance, we obtained flow cytometry of peripheral blood and it was unremarkable.   Since all hematological workup was negative and no additional workup or intervention is needed from hematology standpoint, she was discharged from our office for continued follow-up with her PCP.

## 2023-12-27 DIAGNOSIS — H4052X2 Glaucoma secondary to other eye disorders, left eye, moderate stage: Secondary | ICD-10-CM | POA: Diagnosis not present

## 2024-01-06 DIAGNOSIS — Z947 Corneal transplant status: Secondary | ICD-10-CM | POA: Diagnosis not present

## 2024-01-17 ENCOUNTER — Encounter: Payer: Self-pay | Admitting: Family Medicine

## 2024-01-17 ENCOUNTER — Ambulatory Visit: Payer: Medicaid Other | Admitting: Family Medicine

## 2024-01-17 VITALS — BP 102/64 | HR 70 | Temp 97.5°F | Ht 64.0 in | Wt 164.0 lb

## 2024-01-17 DIAGNOSIS — Z0000 Encounter for general adult medical examination without abnormal findings: Secondary | ICD-10-CM

## 2024-01-17 NOTE — Patient Instructions (Signed)
 It was very nice to see you today!  Vitamin D  2000 iu/day   PLEASE NOTE:  If you had any lab tests please let us  know if you have not heard back within a few days. You may see your results on MyChart before we have a chance to review them but we will give you a call once they are reviewed by us . If we ordered any referrals today, please let us  know if you have not heard from their office within the next week.   Please try these tips to maintain a healthy lifestyle:  Eat most of your calories during the day when you are active. Eliminate processed foods including packaged sweets (pies, cakes, cookies), reduce intake of potatoes, white bread, white pasta, and white rice. Look for whole grain options, oat flour or almond flour.  Each meal should contain half fruits/vegetables, one quarter protein, and one quarter carbs (no bigger than a computer mouse).  Cut down on sweet beverages. This includes juice, soda, and sweet tea. Also watch fruit intake, though this is a healthier sweet option, it still contains natural sugar! Limit to 3 servings daily.  Drink at least 1 glass of water with each meal and aim for at least 8 glasses per day  Exercise at least 150 minutes every week.

## 2024-01-17 NOTE — Progress Notes (Signed)
 Phone [phone]   Subjective:   Patient is a 33 y.o. female presenting for annual physical.    Chief Complaint  Patient presents with   Annual Exam    Physical;    Annual-exercises Discussed the use of AI scribe software for clinical note transcription with the patient, who gave verbal consent to proceed.  History of Present Illness Jessica Barton is a 33 year old female who presents for an annual physical exam.  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  She has been evaluated by a hematologist for her white blood cell count, which was determined to be normal for her. Recent blood tests confirmed normal results.  Her vitamin D  levels, previously low, have returned to normal after taking a high-dose supplement of 50,000 IU. She is currently taking Nutrafol, which may contain vitamin D .  No major headaches, dizziness, syncope, rhinorrhea, congestion, sore throat, chest pains, palpitations, cough, wheezing, dyspnea, nausea, vomiting, diarrhea, constipation, urinary difficulties, or depressive symptoms.  Her menstrual cycles are regular, and her last gynecological exam was over a year ago, with a Pap test conducted last December.  She is actively exercising and has lost 11 pounds over the past year, with her weight decreasing from 175 pounds last October to 168 pounds in September.  She is raising three children     See problem oriented charting- ROS- ROS: Gen: no fever, chills  Skin: no rash, itching ENT: no ear pain, ear drainage, nasal congestion, rhinorrhea, sinus pressure, sore throat Eyes: no blurry vision, double vision Resp: no cough, wheeze,SOB CV: no CP, palpitations, LE edema,  GI: no heartburn, n/v/d/c, abd pain GU: no dysuria, urgency, frequency, hematuria MSK: no joint pain, myalgias, back pain Neuro: no dizziness, headache, weakness, vertigo Psych: no  depression, anxiety, insomnia, SI   The following were reviewed and entered/updated in epic: Past Medical History:  Diagnosis Date   Glaucoma (increased eye pressure)    Normal pregnancy in multigravida in third trimester 09/20/2015   Pregnancy induced hypertension    Preterm delivery 09/20/2015   Patient Active Problem List   Diagnosis Date Noted   Leukopenia 12/13/2023   Vitamin D  deficiency 12/13/2023   Amenorrhea 11/03/2021   Anovulation 11/03/2021   Corneal edema 11/03/2021   Glaucoma 11/03/2021   High risk pregnancy due to history of preterm labor 11/03/2021   Morning sickness 11/03/2021   Oligomenorrhea 11/03/2021   Pregnancy with mental disorders 11/03/2021   Pseudophakia of left eye 11/03/2021   IUGR (intrauterine growth restriction) affecting care of mother, third trimester, fetus 2 10/19/2020   Viral upper respiratory tract infection 02/28/2018   Normal pregnancy in multigravida in third trimester 09/20/2015   Preterm delivery 09/20/2015   Normal labor 09/19/2015   Other specified indication for care or intervention related to labor and delivery, unspecified as to episode of care 10/19/2013   Third degree laceration of perineum during delivery, postpartum 10/19/2013   Supervision of normal pregnancy 08/09/2013   Uterine size-date discrepancy, antepartum 07/19/2013   Past Surgical History:  Procedure Laterality Date   EYE SURGERY Left    mult surg   NOSE SURGERY      Family History  Problem Relation Age of Onset   Diabetes Father    Hypertension Father    Hyperlipidemia Father     Medications- reviewed and updated Current Outpatient Medications  Medication Sig Dispense Refill  Cholecalciferol (D3 PO) Take by mouth daily.     Dorzolamide  HCl-Timolol  Mal PF 2-0.5 % SOLN Place 1 drop into the left eye 2 (two) times daily.     latanoprost (XALATAN) 0.005 % ophthalmic solution Place 1 drop into the left eye at bedtime.     Magnesium  Bisglycinate (MAG  GLYCINATE) 100 MG TABS Take by mouth. (Patient taking differently: Take by mouth. Patient only takes 3 times per month for sleep)     paragard  intrauterine copper  IUD IUD 1 device provided [facility]     prednisoLONE acetate (PRED FORTE) 1 % ophthalmic suspension Place 1 drop into the left eye 4 (four) times daily.     Safety Seal Miscellaneous MISC minoxiil 8% solution - apply to the scalp every morning 30 mL 5   tretinoin  (RETIN-A ) 0.025 % cream Apply topically at bedtime. Use pea size (1gm) amount to face 3 nights a week. 45 g 0   Vitamin D , Ergocalciferol , (DRISDOL ) 1.25 MG (50000 UNIT) CAPS capsule Take 1 capsule (50,000 Units total) by mouth every 7 (seven) days. 13 capsule 1   No current facility-administered medications for this visit.    Allergies-reviewed and updated Allergies  Allergen Reactions   Brimonidine Other (See Comments)    Eye redness   Penicillins Rash    Has patient had a PCN reaction causing immediate rash, facial/tongue/throat swelling, SOB or lightheadedness with hypotension: no Has patient had a PCN reaction causing severe rash involving mucus membranes or skin necrosis: no Has patient had a PCN reaction that required hospitalization no Has patient had a PCN reaction occurring within the last 10 years: No If all of the above answers are NO, then may proceed with Cephalosporin use.     Social History   Social History Narrative   mom   Objective  Objective:  BP 102/64   Pulse 70   Temp (!) 97.5 F (36.4 C)   Ht 5' 4 (1.626 m)   Wt 164 lb (74.4 kg)   LMP 12/18/2023   SpO2 97%   BMI 28.15 kg/m  Physical Exam  Gen: WDWN NAD HEENT: NCAT, conjunctiva not injected, sclera nonicteric TM WNL B, OP moist, no exudates  NECK:  supple, no thyromegaly, no nodes, no carotid bruits CARDIAC: RRR, S1S2+, no murmur. DP 2+B LUNGS: CTAB. No wheezes ABDOMEN:  BS+, soft, NTND, No HSM, no masses EXT:  no edema MSK: no gross abnormalities. MS 5/5 all  4 NEURO: A&O x3.  CN II-XII intact.  PSYCH: normal mood. Good eye contact      Assessment and Plan   Health Maintenance counseling: 1. Anticipatory guidance: Patient counseled regarding regular dental exams q6 months, eye exams,  avoiding smoking and second hand smoke, limiting alcohol to 1 beverage per day, no illicit drugs.   2. Risk factor reduction:  Advised patient of need for regular exercise and diet rich and fruits and vegetables to reduce risk of heart attack and stroke. Exercise- +.  Wt Readings from Last 3 Encounters:  01/17/24 164 lb (74.4 kg)  12/13/23 168 lb (76.2 kg)  08/15/23 166 lb 12.8 oz (75.7 kg)   3. Immunizations/screenings/ancillary studies Immunization History  Administered Date(s) Administered   DTP 05/21/1993, 07/21/1993, 09/24/1993, 06/07/1994   DTaP 06/25/1997   HIB (PRP-T) 07/21/1993, 09/24/1993   HIB, Unspecified 05/21/1993   Hep B, Unspecified Jul 13, 1992, 05/21/1993, 09/24/1993   Hepatitis A, Ped/Adol-2 Dose 08/06/2009, 08/24/2010   Influenza Nasal 02/12/2009   MMR 06/07/1994, 07/22/1997   Meningococcal Conjugate  08/06/2009   OPV 05/21/1993, 07/21/1993, 09/24/1993, 06/25/1997   PFIZER(Purple Top)SARS-COV-2 Vaccination 08/02/2019, 08/23/2019   Tdap 08/06/2009, 09/22/2015, 08/11/2020   Varicella 07/22/1997, 08/06/2009   Health Maintenance Due  Topic Date Due   COVID-19 Vaccine (3 - 2025-26 season) 12/05/2023    4. Cervical cancer screening: utd 5. Skin cancer screening- advised regular sunscreen use. Denies worrisome, changing, or new skin lesions.  6. Birth control/STD check: iud 7. Smoking associated screening: non smoker 8. Alcohol screening: neg  Wellness examination   Annual-reviewed labs from Sept.  No need to repeat.  Antic guidance.  Praised for wt loss and life-style changes.   Assessment and Plan Assessment & Plan Corneal transplant status   The corneal transplant is well-managed with no signs of rejection. Vision improvement is  noted but hard to quantify.  Leukopenia   Leukopenia was evaluated by a hematologist and determined to be benign. Recent blood tests returned normal results.  Vitamin D  deficiency   Vitamin D  deficiency has resolved with high-dose supplementation. Current vitamin D  levels are normal. Check Nutrafol for vitamin D  content. Take a total of 2000 IU of vitamin D  daily after completing the current high-dose regimen.    Recommended follow up: Return in about 1 year (around 01/16/2025) for annual physical.  Lab/Order associations:n/a fasting   Jessica CHRISTELLA Carrel, MD

## 2024-02-13 DIAGNOSIS — Z947 Corneal transplant status: Secondary | ICD-10-CM | POA: Diagnosis not present

## 2024-02-24 DIAGNOSIS — H4052X2 Glaucoma secondary to other eye disorders, left eye, moderate stage: Secondary | ICD-10-CM | POA: Diagnosis not present

## 2024-02-24 DIAGNOSIS — Z947 Corneal transplant status: Secondary | ICD-10-CM | POA: Diagnosis not present

## 2024-03-13 ENCOUNTER — Ambulatory Visit: Admitting: Oncology

## 2024-03-13 ENCOUNTER — Other Ambulatory Visit

## 2024-03-16 DIAGNOSIS — Z419 Encounter for procedure for purposes other than remedying health state, unspecified: Secondary | ICD-10-CM | POA: Diagnosis not present

## 2024-06-18 ENCOUNTER — Ambulatory Visit: Admitting: Dermatology

## 2025-01-17 ENCOUNTER — Encounter: Admitting: Family Medicine
